# Patient Record
Sex: Female | Born: 1950
Health system: Southern US, Community
[De-identification: ages and names within clinical notes are randomized; demographics above are authoritative.]

## PROBLEM LIST (undated history)

## (undated) DIAGNOSIS — H409 Unspecified glaucoma: Secondary | ICD-10-CM

## (undated) DIAGNOSIS — E039 Hypothyroidism, unspecified: Secondary | ICD-10-CM

## (undated) DIAGNOSIS — E785 Hyperlipidemia, unspecified: Secondary | ICD-10-CM

## (undated) DIAGNOSIS — I10 Essential (primary) hypertension: Secondary | ICD-10-CM

## (undated) DIAGNOSIS — C50919 Malignant neoplasm of unspecified site of unspecified female breast: Secondary | ICD-10-CM

## (undated) DIAGNOSIS — Z923 Personal history of irradiation: Secondary | ICD-10-CM

## (undated) DIAGNOSIS — F419 Anxiety disorder, unspecified: Secondary | ICD-10-CM

## (undated) DIAGNOSIS — E079 Disorder of thyroid, unspecified: Secondary | ICD-10-CM

## (undated) HISTORY — DX: Disorder of thyroid, unspecified: E07.9

## (undated) HISTORY — PX: OTHER SURGICAL HISTORY: SHX169

## (undated) HISTORY — PX: COLONOSCOPY: SHX174

## (undated) HISTORY — PX: REFRACTIVE SURGERY: SHX103

## (undated) HISTORY — PX: CATARACT EXTRACTION: SUR2

## (undated) HISTORY — DX: Anxiety disorder, unspecified: F41.9

## (undated) HISTORY — DX: Hyperlipidemia, unspecified: E78.5

## (undated) HISTORY — DX: Essential (primary) hypertension: I10

## (undated) HISTORY — DX: Unspecified glaucoma: H40.9

---

## 2010-07-17 ENCOUNTER — Emergency Department (HOSPITAL_COMMUNITY): Payer: BC Managed Care – PPO

## 2010-07-17 ENCOUNTER — Emergency Department (HOSPITAL_COMMUNITY)
Admission: EM | Admit: 2010-07-17 | Discharge: 2010-07-18 | Disposition: A | Discharge status: Home / Self Care | Payer: BC Managed Care – PPO | Attending: Emergency Medicine | Admitting: Emergency Medicine

## 2010-07-17 DIAGNOSIS — I10 Essential (primary) hypertension: Secondary | ICD-10-CM | POA: Insufficient documentation

## 2010-07-17 DIAGNOSIS — R059 Cough, unspecified: Secondary | ICD-10-CM | POA: Insufficient documentation

## 2010-07-17 DIAGNOSIS — R0602 Shortness of breath: Secondary | ICD-10-CM | POA: Insufficient documentation

## 2010-07-17 DIAGNOSIS — E039 Hypothyroidism, unspecified: Secondary | ICD-10-CM | POA: Insufficient documentation

## 2010-07-17 DIAGNOSIS — E7800 Pure hypercholesterolemia, unspecified: Secondary | ICD-10-CM | POA: Insufficient documentation

## 2010-07-17 DIAGNOSIS — J189 Pneumonia, unspecified organism: Secondary | ICD-10-CM | POA: Insufficient documentation

## 2010-07-17 DIAGNOSIS — R05 Cough: Secondary | ICD-10-CM | POA: Insufficient documentation

## 2010-07-17 LAB — URINE MICROSCOPIC-ADD ON

## 2010-07-17 LAB — COMPREHENSIVE METABOLIC PANEL
AST: 25 U/L (ref 0–37)
CO2: 21 mEq/L (ref 19–32)
Calcium: 9.3 mg/dL (ref 8.4–10.5)
Creatinine, Ser: 1.5 mg/dL — ABNORMAL HIGH (ref 0.4–1.2)
GFR calc Af Amer: 43 mL/min — ABNORMAL LOW (ref >60)
GFR calc non Af Amer: 35 mL/min — ABNORMAL LOW (ref >60)
Total Protein: 7.1 g/dL (ref 6.0–8.3)

## 2010-07-17 LAB — CBC
HCT: 49.4 % — ABNORMAL HIGH (ref 36.0–46.0)
Hemoglobin: 17.5 g/dL — ABNORMAL HIGH (ref 12.0–15.0)
MCH: 29.5 pg (ref 26.0–34.0)
MCV: 83.2 fL (ref 78.0–100.0)
RBC: 5.94 MIL/uL — ABNORMAL HIGH (ref 3.87–5.11)

## 2010-07-17 LAB — APTT: aPTT: 30 seconds (ref 24–37)

## 2010-07-17 LAB — URINALYSIS, ROUTINE W REFLEX MICROSCOPIC
Bilirubin Urine: NEGATIVE
Leukocytes, UA: NEGATIVE
Nitrite: NEGATIVE
Specific Gravity, Urine: 1.013 (ref 1.005–1.030)
pH: 5 (ref 5.0–8.0)

## 2010-07-17 LAB — POCT CARDIAC MARKERS: Troponin i, poc: 0.06 ng/mL (ref 0.00–0.09)

## 2010-07-17 LAB — PROTIME-INR
INR: 0.91 (ref 0.00–1.49)
Prothrombin Time: 12.5 seconds (ref 11.6–15.2)

## 2010-07-17 LAB — DIFFERENTIAL
Lymphocytes Relative: 6 % — ABNORMAL LOW (ref 12–46)
Lymphs Abs: 1 10*3/uL (ref 0.7–4.0)
Monocytes Absolute: 0.7 10*3/uL (ref 0.1–1.0)
Monocytes Relative: 4 % (ref 3–12)
Neutro Abs: 14.6 10*3/uL — ABNORMAL HIGH (ref 1.7–7.7)
Neutrophils Relative %: 90 % — ABNORMAL HIGH (ref 43–77)

## 2010-07-17 MED ORDER — IOHEXOL 300 MG/ML  SOLN
100.0000 mL | Freq: Once | INTRAMUSCULAR | Status: AC | PRN
  Administered 2010-07-17: 100 mL via INTRAVENOUS

## 2010-07-18 LAB — POCT CARDIAC MARKERS
Myoglobin, poc: 179 ng/mL (ref 12–200)
Troponin i, poc: 0.05 ng/mL (ref 0.00–0.09)

## 2010-10-23 ENCOUNTER — Other Ambulatory Visit (HOSPITAL_COMMUNITY)
Admission: RE | Admit: 2010-10-23 | Discharge: 2010-10-23 | Disposition: A | Discharge status: Home / Self Care | Payer: BC Managed Care – PPO | Source: Ambulatory Visit | Attending: Family Medicine | Admitting: Family Medicine

## 2010-10-23 DIAGNOSIS — Z1159 Encounter for screening for other viral diseases: Secondary | ICD-10-CM | POA: Insufficient documentation

## 2010-10-23 DIAGNOSIS — Z124 Encounter for screening for malignant neoplasm of cervix: Secondary | ICD-10-CM | POA: Insufficient documentation

## 2010-11-13 ENCOUNTER — Other Ambulatory Visit: Payer: Self-pay | Admitting: Family Medicine

## 2010-11-13 DIAGNOSIS — Z1231 Encounter for screening mammogram for malignant neoplasm of breast: Secondary | ICD-10-CM

## 2010-11-27 ENCOUNTER — Ambulatory Visit
Admission: RE | Admit: 2010-11-27 | Discharge: 2010-11-27 | Disposition: A | Discharge status: Home / Self Care | Payer: BC Managed Care – PPO | Source: Ambulatory Visit | Attending: Family Medicine | Admitting: Family Medicine

## 2010-11-27 DIAGNOSIS — Z1231 Encounter for screening mammogram for malignant neoplasm of breast: Secondary | ICD-10-CM

## 2011-12-12 ENCOUNTER — Other Ambulatory Visit: Payer: Self-pay | Admitting: Family Medicine

## 2011-12-12 DIAGNOSIS — Z1231 Encounter for screening mammogram for malignant neoplasm of breast: Secondary | ICD-10-CM

## 2012-01-21 ENCOUNTER — Ambulatory Visit
Admission: RE | Admit: 2012-01-21 | Discharge: 2012-01-21 | Disposition: A | Discharge status: Home / Self Care | Payer: BC Managed Care – PPO | Source: Ambulatory Visit | Attending: Family Medicine | Admitting: Family Medicine

## 2012-01-21 DIAGNOSIS — Z1231 Encounter for screening mammogram for malignant neoplasm of breast: Secondary | ICD-10-CM

## 2012-02-21 ENCOUNTER — Other Ambulatory Visit: Payer: Self-pay | Admitting: Family Medicine

## 2012-02-21 DIAGNOSIS — Z780 Asymptomatic menopausal state: Secondary | ICD-10-CM

## 2012-03-17 ENCOUNTER — Ambulatory Visit
Admission: RE | Admit: 2012-03-17 | Discharge: 2012-03-17 | Disposition: A | Discharge status: Home / Self Care | Payer: BC Managed Care – PPO | Source: Ambulatory Visit | Attending: Family Medicine | Admitting: Family Medicine

## 2012-03-17 DIAGNOSIS — Z780 Asymptomatic menopausal state: Secondary | ICD-10-CM

## 2013-01-19 ENCOUNTER — Other Ambulatory Visit: Payer: Self-pay

## 2013-01-19 DIAGNOSIS — Z1231 Encounter for screening mammogram for malignant neoplasm of breast: Secondary | ICD-10-CM

## 2013-02-23 ENCOUNTER — Ambulatory Visit
Admission: RE | Admit: 2013-02-23 | Discharge: 2013-02-23 | Disposition: A | Discharge status: Home / Self Care | Payer: BC Managed Care – PPO | Source: Ambulatory Visit

## 2013-02-23 DIAGNOSIS — Z1231 Encounter for screening mammogram for malignant neoplasm of breast: Secondary | ICD-10-CM

## 2014-02-04 ENCOUNTER — Other Ambulatory Visit: Payer: Self-pay

## 2014-02-04 DIAGNOSIS — Z1231 Encounter for screening mammogram for malignant neoplasm of breast: Secondary | ICD-10-CM

## 2014-03-15 ENCOUNTER — Ambulatory Visit
Admission: RE | Admit: 2014-03-15 | Discharge: 2014-03-15 | Disposition: A | Discharge status: Home / Self Care | Payer: Medicare PPO | Source: Ambulatory Visit

## 2014-03-15 ENCOUNTER — Encounter (INDEPENDENT_AMBULATORY_CARE_PROVIDER_SITE_OTHER): Payer: Self-pay

## 2014-03-15 DIAGNOSIS — Z1231 Encounter for screening mammogram for malignant neoplasm of breast: Secondary | ICD-10-CM

## 2014-09-27 DIAGNOSIS — Z79899 Other long term (current) drug therapy: Secondary | ICD-10-CM | POA: Diagnosis not present

## 2014-09-27 DIAGNOSIS — H34813 Central retinal vein occlusion, bilateral: Secondary | ICD-10-CM | POA: Diagnosis not present

## 2014-09-27 DIAGNOSIS — H35353 Cystoid macular degeneration, bilateral: Secondary | ICD-10-CM | POA: Diagnosis not present

## 2014-11-15 DIAGNOSIS — H409 Unspecified glaucoma: Secondary | ICD-10-CM | POA: Diagnosis not present

## 2014-11-15 DIAGNOSIS — H35353 Cystoid macular degeneration, bilateral: Secondary | ICD-10-CM | POA: Diagnosis not present

## 2014-11-15 DIAGNOSIS — H34813 Central retinal vein occlusion, bilateral: Secondary | ICD-10-CM | POA: Diagnosis not present

## 2014-11-15 DIAGNOSIS — H269 Unspecified cataract: Secondary | ICD-10-CM | POA: Diagnosis not present

## 2014-12-27 DIAGNOSIS — H348132 Central retinal vein occlusion, bilateral, stable: Secondary | ICD-10-CM | POA: Diagnosis not present

## 2014-12-27 DIAGNOSIS — H35353 Cystoid macular degeneration, bilateral: Secondary | ICD-10-CM | POA: Diagnosis not present

## 2015-01-10 DIAGNOSIS — H2513 Age-related nuclear cataract, bilateral: Secondary | ICD-10-CM | POA: Diagnosis not present

## 2015-01-10 DIAGNOSIS — H348132 Central retinal vein occlusion, bilateral, stable: Secondary | ICD-10-CM | POA: Diagnosis not present

## 2015-01-10 DIAGNOSIS — H578 Other specified disorders of eye and adnexa: Secondary | ICD-10-CM | POA: Diagnosis not present

## 2015-01-10 DIAGNOSIS — H4052X3 Glaucoma secondary to other eye disorders, left eye, severe stage: Secondary | ICD-10-CM | POA: Diagnosis not present

## 2015-01-10 DIAGNOSIS — H401111 Primary open-angle glaucoma, right eye, mild stage: Secondary | ICD-10-CM | POA: Diagnosis not present

## 2015-02-07 DIAGNOSIS — H35353 Cystoid macular degeneration, bilateral: Secondary | ICD-10-CM | POA: Diagnosis not present

## 2015-02-07 DIAGNOSIS — H348132 Central retinal vein occlusion, bilateral, stable: Secondary | ICD-10-CM | POA: Diagnosis not present

## 2015-02-07 DIAGNOSIS — Z79899 Other long term (current) drug therapy: Secondary | ICD-10-CM | POA: Diagnosis not present

## 2015-02-16 DIAGNOSIS — H401111 Primary open-angle glaucoma, right eye, mild stage: Secondary | ICD-10-CM | POA: Diagnosis not present

## 2015-02-16 DIAGNOSIS — H2511 Age-related nuclear cataract, right eye: Secondary | ICD-10-CM | POA: Diagnosis not present

## 2015-03-14 ENCOUNTER — Other Ambulatory Visit: Payer: Self-pay | Admitting: Family Medicine

## 2015-03-14 DIAGNOSIS — M8580 Other specified disorders of bone density and structure, unspecified site: Secondary | ICD-10-CM

## 2015-03-14 DIAGNOSIS — Z1231 Encounter for screening mammogram for malignant neoplasm of breast: Secondary | ICD-10-CM

## 2015-04-11 ENCOUNTER — Other Ambulatory Visit: Payer: Medicare PPO

## 2015-04-11 ENCOUNTER — Ambulatory Visit: Payer: Medicare PPO

## 2015-05-03 ENCOUNTER — Ambulatory Visit
Admission: RE | Admit: 2015-05-03 | Discharge: 2015-05-03 | Disposition: A | Discharge status: Home / Self Care | Payer: Medicare Other | Source: Ambulatory Visit | Attending: Family Medicine | Admitting: Family Medicine

## 2015-05-03 DIAGNOSIS — M8580 Other specified disorders of bone density and structure, unspecified site: Secondary | ICD-10-CM

## 2015-05-03 DIAGNOSIS — Z1231 Encounter for screening mammogram for malignant neoplasm of breast: Secondary | ICD-10-CM

## 2015-05-05 ENCOUNTER — Other Ambulatory Visit: Payer: Self-pay | Admitting: Family Medicine

## 2015-05-05 DIAGNOSIS — R928 Other abnormal and inconclusive findings on diagnostic imaging of breast: Secondary | ICD-10-CM

## 2015-05-12 ENCOUNTER — Ambulatory Visit
Admission: RE | Admit: 2015-05-12 | Discharge: 2015-05-12 | Disposition: A | Discharge status: Home / Self Care | Payer: Medicare Other | Source: Ambulatory Visit | Attending: Family Medicine | Admitting: Family Medicine

## 2015-05-12 ENCOUNTER — Other Ambulatory Visit: Payer: Self-pay | Admitting: Family Medicine

## 2015-05-12 DIAGNOSIS — R928 Other abnormal and inconclusive findings on diagnostic imaging of breast: Secondary | ICD-10-CM

## 2015-05-23 ENCOUNTER — Other Ambulatory Visit: Payer: Self-pay | Admitting: Family Medicine

## 2015-05-23 ENCOUNTER — Ambulatory Visit
Admission: RE | Admit: 2015-05-23 | Discharge: 2015-05-23 | Disposition: A | Discharge status: Home / Self Care | Payer: Medicare Other | Source: Ambulatory Visit | Attending: Family Medicine | Admitting: Family Medicine

## 2015-05-23 ENCOUNTER — Ambulatory Visit
Admission: RE | Admit: 2015-05-23 | Discharge: 2015-05-23 | Disposition: A | Discharge status: Home / Self Care | Payer: No Typology Code available for payment source | Source: Ambulatory Visit | Attending: Family Medicine | Admitting: Family Medicine

## 2015-05-23 DIAGNOSIS — R928 Other abnormal and inconclusive findings on diagnostic imaging of breast: Secondary | ICD-10-CM

## 2015-05-23 DIAGNOSIS — N6310 Unspecified lump in the right breast, unspecified quadrant: Secondary | ICD-10-CM

## 2015-05-23 HISTORY — PX: BREAST BIOPSY: SHX20

## 2015-05-26 ENCOUNTER — Telehealth: Payer: Self-pay | Admitting: *Deleted

## 2015-05-26 DIAGNOSIS — C50511 Malignant neoplasm of lower-outer quadrant of right female breast: Secondary | ICD-10-CM

## 2015-05-26 NOTE — Telephone Encounter (Signed)
Confirmed [facility] for 06/01/15 at 815am .  Instructions and contact information given.

## 2015-06-01 ENCOUNTER — Other Ambulatory Visit: Payer: Self-pay | Admitting: General Surgery

## 2015-06-01 ENCOUNTER — Ambulatory Visit
Admission: RE | Admit: 2015-06-01 | Discharge: 2015-06-01 | Disposition: A | Discharge status: Home / Self Care | Payer: Medicare Other | Source: Ambulatory Visit | Attending: Radiation Oncology | Admitting: Radiation Oncology

## 2015-06-01 ENCOUNTER — Encounter: Payer: Self-pay | Admitting: *Deleted

## 2015-06-01 ENCOUNTER — Other Ambulatory Visit (HOSPITAL_BASED_OUTPATIENT_CLINIC_OR_DEPARTMENT_OTHER): Payer: Medicare Other

## 2015-06-01 ENCOUNTER — Encounter: Payer: Self-pay | Admitting: Physical Therapy

## 2015-06-01 ENCOUNTER — Ambulatory Visit: Payer: Medicare Other | Attending: General Surgery | Admitting: Physical Therapy

## 2015-06-01 ENCOUNTER — Encounter: Payer: Self-pay | Admitting: Radiation Oncology

## 2015-06-01 ENCOUNTER — Encounter: Payer: Self-pay | Admitting: Hematology and Oncology

## 2015-06-01 ENCOUNTER — Ambulatory Visit (HOSPITAL_BASED_OUTPATIENT_CLINIC_OR_DEPARTMENT_OTHER): Payer: Medicare Other | Admitting: Hematology and Oncology

## 2015-06-01 ENCOUNTER — Encounter: Payer: Self-pay | Admitting: Skilled Nursing Facility1

## 2015-06-01 VITALS — BP 168/82 | HR 95 | Temp 97.5°F | Resp 20 | Ht 64.75 in | Wt 193.3 lb

## 2015-06-01 DIAGNOSIS — C50511 Malignant neoplasm of lower-outer quadrant of right female breast: Secondary | ICD-10-CM

## 2015-06-01 DIAGNOSIS — R293 Abnormal posture: Secondary | ICD-10-CM | POA: Insufficient documentation

## 2015-06-01 LAB — CBC WITH DIFFERENTIAL/PLATELET
BASO%: 0.3 % (ref 0.0–2.0)
Basophils Absolute: 0 10*3/uL (ref 0.0–0.1)
EOS%: 2.5 % (ref 0.0–7.0)
Eosinophils Absolute: 0.2 10*3/uL (ref 0.0–0.5)
HEMATOCRIT: 40.7 % (ref 34.8–46.6)
HGB: 14.1 g/dL (ref 11.6–15.9)
LYMPH#: 1.3 10*3/uL (ref 0.9–3.3)
LYMPH%: 17 % (ref 14.0–49.7)
MCH: 30.4 pg (ref 25.1–34.0)
MCHC: 34.6 g/dL (ref 31.5–36.0)
MCV: 87.7 fL (ref 79.5–101.0)
MONO#: 0.6 10*3/uL (ref 0.1–0.9)
MONO%: 7.7 % (ref 0.0–14.0)
NEUT%: 72.5 % (ref 38.4–76.8)
NEUTROS ABS: 5.5 10*3/uL (ref 1.5–6.5)
PLATELETS: 280 10*3/uL (ref 145–400)
RBC: 4.64 10*6/uL (ref 3.70–5.45)
RDW: 13.8 % (ref 11.2–14.5)
WBC: 7.6 10*3/uL (ref 3.9–10.3)

## 2015-06-01 LAB — COMPREHENSIVE METABOLIC PANEL
ALBUMIN: 2.9 g/dL — AB (ref 3.5–5.0)
ALT: 21 U/L (ref 0–55)
ANION GAP: 9 meq/L (ref 3–11)
AST: 15 U/L (ref 5–34)
Alkaline Phosphatase: 141 U/L (ref 40–150)
BILIRUBIN TOTAL: 0.48 mg/dL (ref 0.20–1.20)
BUN: 28.2 mg/dL — ABNORMAL HIGH (ref 7.0–26.0)
CALCIUM: 10.1 mg/dL (ref 8.4–10.4)
CHLORIDE: 99 meq/L (ref 98–109)
CO2: 30 meq/L — AB (ref 22–29)
CREATININE: 1.2 mg/dL — AB (ref 0.6–1.1)
EGFR: 49 mL/min/{1.73_m2} — ABNORMAL LOW (ref >90)
Glucose: 114 mg/dl (ref 70–140)
Potassium: 3.3 mEq/L — ABNORMAL LOW (ref 3.5–5.1)
Sodium: 139 mEq/L (ref 136–145)
TOTAL PROTEIN: 7.4 g/dL (ref 6.4–8.3)

## 2015-06-01 NOTE — Progress Notes (Signed)
Subjective:     Patient ID: Tiffany Thompson, female   DOB: December 27, 1950, 65 y.o.   MRN: [id number]  HPI   Review of Systems     Objective:   Physical Exam For the patient to understand and be given the tools to implement a healthy plant based diet during their cancer diagnosis.     Assessment:     Patient was seen today and found to be in good spirits and accompanied by her daughter. Pts ht 5'4'', 193 pounds, BMI 32.5. Pts labs: potassium 3.3, CO2 30, BUN 28.2, Creatinine 1.2, Albumin 2.9, GFR 49. Pts ht 5'4'', 193 pounds, BMI 32.5. Pts medicaitions: atorvastatin, vitamin D, cinnamon, levothyroxine, omega 3.  Pt seemed interested as long as she can still eat meat.     Plan:     Dietitian educated the patient on implementing a plant based diet by incorporating more plant proteins, fruits, and vegetables. As a part of a healthy routine physical activity was discussed. The importance of legitimate, evidence based information was discussed and examples were given. A folder of evidence based information with a focus on a plant based diet and general nutrition during cancer was given to the patient.  As a part of the continuum of care the cancer dietitian's contact information was given to the patient in the event they would like to have a follow up appointment.

## 2015-06-01 NOTE — Progress Notes (Signed)
[facility] Psychosocial Distress Screening Clinical Social Work  Clinical Social Work met with pt and her daughter to introduce self, explain role of CSW/Support Team and to review distress screening protocol.  The patient scored a 6 on the Psychosocial Distress Thermometer which indicates moderate distress. Pt reports she had issues with anxiety years ago and was prescribed medication to assist. She reports she reached out to her PCP for assistance after diagnosis and feels this is being well addressed. She reports talking with family and friends is helpful as well. Her 6yo grandson is a great distraction currently. Clinical Social Worker further assessed for distress and other psychosocial needs. Pt feels she has good strategies for coping currently. CSW reviewed common emotions/reactions to recent breast cancer diagnosis and coping techniques/resources to assist. Support Program calendar provided today. Pt and daughter encouraged to reach out as needs arise.   ONCBCN DISTRESS SCREENING 06/01/2015  Screening Type Initial Screening  Distress experienced in past week (1-10) 6  Emotional problem type Nervousness/Anxiety;Adjusting to illness;Isolation/feeling alone;Adjusting to appearance changes  Spiritual/Religous concerns type Facing my mortality  Information Concerns Type Lack of info about diagnosis;Lack of info about treatment;Lack of info about complementary therapy choices;Lack of info about maintaining fitness  Physical Problem type Skin dry/itchy  Physician notified of physical symptoms Yes  Referral to clinical social work Yes  Referral to support programs Yes     Clinical Social Worker follow up needed: No.  If yes, follow up plan:

## 2015-06-01 NOTE — Progress Notes (Signed)
Radiation Oncology         [phone] ________________________________  Name: Tiffany Thompson MRN: [id number]  Date: 06/01/2015  DOB: 03-May-1950  TK:[id number] R Rankins, MD  Fanny Skates, MD     REFERRING PHYSICIAN: Fanny Skates, MD   DIAGNOSIS: The encounter diagnosis was Breast cancer of lower-outer quadrant of right female breast [facility]).   HISTORY OF PRESENT ILLNESS: Tiffany Thompson is a 65 y.o. female seen at the in the [facility] Breast clinic after a screening mammogram on 05/03/15 revealed a possible mass of the right breast. A ultrasound on 05/12/15 revealed a 1 x .7 x 1 cm mass along the 8 o'clock position. No adenopathy was noted in the axilla. A biopsy was performed and revealed a ER/PR positive, HER2 negative invasive ductal carcinoma with DCIS. She comes today to meet with Dr. [name] for discussion regarding the role of radiotherapy.     PREVIOUS RADIATION THERAPY: No   PAST MEDICAL HISTORY:  Past Medical History  Diagnosis Date  . Hypertension   . Hyperlipidemia     HYPERCHOLESTEROLEMIA  . Thyroid disease   . Glaucoma     WITH CENTRAL RETINAL VEIN OCCLUSION       PAST SURGICAL HISTORY: Past Surgical History  Procedure Laterality Date  . Tube for glaucoma      TO REDUCE PRESSURE  . Colonoscopy       FAMILY HISTORY:  Family History  Problem Relation Age of Onset  . Diabetes Mellitus I Mother   . Hyperlipidemia Mother   . Heart failure Mother   . Hypertension Father   . Heart failure Father   . Diabetes Mellitus I Father   . Hypertension Sister   . Diabetes Mellitus I Sister   . Hypertension Sister   . Dementia Sister   . Brain cancer Brother      SOCIAL HISTORY:  reports that she has quit smoking. She does not have any smokeless tobacco history on file. She is a retired Pharmacist, hospital. She is widowed and resides in [place].    ALLERGIES: Review of patient's allergies indicates no known allergies.   MEDICATIONS:  Current Outpatient  Prescriptions  Medication Sig Dispense Refill  . atorvastatin (LIPITOR) 20 MG tablet Take 20 mg by mouth daily.    . Calcium Carbonate-Vitamin D (CALCIUM-VITAMIN D) 500-200 MG-UNIT per tablet Take 1 tablet by mouth daily.    . Cinnamon 500 MG capsule Take 100 mg by mouth daily.    . clonazePAM (KLONOPIN) 1 MG tablet Take 1 mg by mouth 2 (two) times daily.    . dorzolamide (TRUSOPT) 2 % ophthalmic solution 1 drop 3 (three) times daily.    . hydrochlorothiazide (HYDRODIURIL) 25 MG tablet Take 25 mg by mouth daily.    Marland Kitchen ibuprofen (ADVIL,MOTRIN) 200 MG tablet Take 200 mg by mouth every 6 (six) hours as needed.    . latanoprost (XALATAN) 0.005 % ophthalmic solution 1 drop at bedtime.    Marland Kitchen levothyroxine (SYNTHROID, LEVOTHROID) 112 MCG tablet Take 112 mcg by mouth daily before breakfast.    . Omega-3 Fatty Acids (FISH OIL) 1000 MG CAPS Take by mouth.    . potassium chloride SA (K-DUR,KLOR-CON) 20 MEQ tablet Take 20 mEq by mouth 2 (two) times daily.    Marland Kitchen telmisartan (MICARDIS) 40 MG tablet Take 40 mg by mouth daily.     No current facility-administered medications for this encounter.     REVIEW OF SYSTEMS: On review of systems, the patient states she is doing  well. She denies any pain since her biopsy. She denies any chest pain, shortness of breath, cough, fevers, chills, night sweats, unintended weight changes. She denies any bowel or bladder disturbances, and denies abdominal pain, nausea or vomiting. She denies any new musculoskeletal or joint aches or pains. A complete review of systems is obtained and is otherwise negative.     PHYSICAL EXAM:   Pain scale 0/10 In general this is a well appearing caucasian female in no acute distress. She is alert and oriented x4 and appropriate throughout the examination. HEENT reveals that the patient is normocephalic, atraumatic. EOMs are intact. PERRLA. Skin is intact without any evidence of gross lesions. Cardiovascular exam reveals a regular rate and  rhythm, no clicks rubs or murmurs are auscultated. Chest is clear to auscultation bilaterally. Lymphatic assessment is performed and does not reveal any adenopathy in the cervical, supraclavicular, axillary, or inguinal chains. Breast exam of the right breast reveals post biopsy change along the lower outer quadrant. She does not have any palpable abnormalities otherwise, the left breast is negative for palpable mass. No nipple discharge is noted or skin changes otherwise bilaterally.  Abdomen has active bowel sounds in all quadrants and is intact. The abdomen is soft, non tender, non distended. Lower extremities are negative for pretibial pitting edema, deep calf tenderness, cyanosis or clubbing.   ECOG = 0  0 - Asymptomatic (Fully active, able to carry on all predisease activities without restriction)  1 - Symptomatic but completely ambulatory (Restricted in physically strenuous activity but ambulatory and able to carry out work of a light or sedentary nature. For example, light housework, office work)  2 - Symptomatic, <50% in bed during the day (Ambulatory and capable of all self care but unable to carry out any work activities. Up and about more than 50% of waking hours)  3 - Symptomatic, >50% in bed, but not bedbound (Capable of only limited self-care, confined to bed or chair 50% or more of waking hours)  4 - Bedbound (Completely disabled. Cannot carry on any self-care. Totally confined to bed or chair)  5 - Death   Eustace Pen MM, Creech RH, Tormey DC, et al. [id number]). "Toxicity and response criteria of the [facility]". [place] Oncol. 5 (6): 649-55    LABORATORY DATA:  Lab Results  Component Value Date   WBC 16.3* 07/17/2010   HGB 17.5* 07/17/2010   HCT 49.4* 07/17/2010   MCV 83.2 07/17/2010   PLT 276 07/17/2010   Lab Results  Component Value Date   NA 137 07/17/2010   K 3.6 07/17/2010   CL 103 07/17/2010   CO2 21 07/17/2010   Lab Results  Component  Value Date   ALT 23 07/17/2010   AST 25 07/17/2010   ALKPHOS 121* 07/17/2010   BILITOT 0.4 07/17/2010      RADIOGRAPHY: Dg Bone Density  05/03/2015  EXAM: DUAL X-RAY ABSORPTIOMETRY (DXA) FOR BONE MINERAL DENSITY IMPRESSION: Referring Physician:  Milagros Evener PATIENT: Name: CHRISTYANNA, MCKEON Patient ID: [id number] Birth Date: 06-24-1950 Height: 65.5 in. Sex: Female Measured: 05/03/2015 Weight: 191.0 lbs. Indications: Caucasian, Estrogen Deficient, Hypothyroid, Postmenopausal, Synthroid Fractures: Treatments: Calcium (E943.0), Vitamin D (E933.5) ASSESSMENT: The BMD measured at Femur Neck Right is 0.883 g/cm2 with a T-score of -1.1. This patient is considered to be osteopenic according to [facility]) criteria. L-4 was excluded due to degenerative changes. There has been no statistically significant change in BMD of Left hip since prior exam dated 03/17/2012.  Site Region Measured Date Measured Age YA BMD Significant CHANGE T-score DualFemur Neck Right 05/03/2015    64.2         -1.1    0.883 g/cm2 AP Spine  L1-L3      05/03/2015    [age]         1.1     1.317 g/cm2 World Health Organization [facility]) criteria for post-menopausal, Caucasian Women: Normal       T-score at or above -1 SD Osteopenia   T-score between -1 and -2.5 SD Osteoporosis T-score at or below -2.5 SD RECOMMENDATION: [facility] recommends that FDA-approved medical therapies be considered in postmenopausal women and men age 70 or older with a: 1. Hip or vertebral (clinical or morphometric) fracture. 2. T-score of <-2.5 at the spine or hip. 3. Ten-year fracture probability by FRAX of 3% or greater for hip fracture or 20% or greater for major osteoporotic fracture. All treatment decisions require clinical judgment and consideration of individual patient factors, including patient preferences, co-morbidities, previous drug use, risk factors not captured in the FRAX model (e.g. falls, vitamin D deficiency,  increased bone turnover, interval significant decline in bone density) and possible under - or over-estimation of fracture risk by FRAX. All patients should ensure an adequate intake of dietary calcium (1200 mg/d) and vitamin D (800 IU daily) unless contraindicated. FOLLOW-UP: People with diagnosed cases of osteoporosis or at high risk for fracture should have regular bone mineral density tests. For patients eligible for Medicare, routine testing is allowed once every 2 years. The testing frequency can be increased to one year for patients who have rapidly progressing disease, those who are receiving or discontinuing medical therapy to restore bone mass, or have additional risk factors. I have reviewed this report, and agree with the above findings. [facility] FRAX* 10-year Probability of Fracture Based on femoral neck BMD: DualFemur (Right) Major Osteoporotic Fracture: 8.1% Hip Fracture:                0.7% Population:                  [place] (Caucasian) Risk Factors:                None *FRAX is a Materials engineer of the [employer] for Metabolic Bone Disease, a World Pharmacologist (WHO) [facility]. ASSESSMENT: The probability of a major osteoporotic fracture is 8.1 % within the next ten years. The probability of a hip fracture is 0.7 % within the next ten years. Electronically Signed   By: David  Martinique M.D.   On: 05/03/2015 11:34   Mm Digital Diagnostic Unilat R  05/23/2015  CLINICAL DATA:  Ultrasound-guided biopsy was recommended and performed. A 10 mm suspicious mass in the 8 o'clock position of the right breast was performed. EXAM: DIAGNOSTIC RIGHT MAMMOGRAM POST ULTRASOUND BIOPSY COMPARISON:  Previous exam(s). FINDINGS: Mammographic images were obtained following ultrasound guided biopsy of a 10 mm mass 8 o'clock position right breast 8 cm from the nipple. A ribbon shaped biopsy clip is satisfactorily positioned along the posteromedial margin of the  biopsied mass. IMPRESSION: Satisfactory position of ribbon shaped biopsy clip, along the posteromedial margin of the mass. Final Assessment: Post Procedure Mammograms for Marker Placement Electronically Signed   By: Curlene Dolphin M.D.   On: 05/23/2015 14:37   Mm Digital Screening Bilateral  05/04/2015  CLINICAL DATA:  Screening. EXAM: DIGITAL SCREENING BILATERAL MAMMOGRAM WITH CAD COMPARISON:  Previous exam(s). ACR  Breast Density Category b: There are scattered areas of fibroglandular density. FINDINGS: In the right breast, a possible mass warrants further evaluation. In the left breast, no findings suspicious for malignancy. Images were processed with CAD. IMPRESSION: Further evaluation is suggested for possible mass in the right breast. RECOMMENDATION: Diagnostic mammogram and possibly ultrasound of the right breast. (Code:[id number]-72M) The patient will be contacted regarding the findings, and additional imaging will be scheduled. BI-RADS CATEGORY  0: Incomplete. Need additional imaging evaluation and/or prior mammograms for comparison. Electronically Signed   By: Margarette [place] M.D.   On: 05/04/2015 14:24   US Breast Ltd Uni Right Inc Axilla  05/12/2015  CLINICAL DATA:  65 year old female, callback from screening mammogram for possible right breast mass EXAM: DIGITAL DIAGNOSTIC RIGHT MAMMOGRAM WITH 3D TOMOSYNTHESIS WITH CAD ULTRASOUND RIGHT BREAST COMPARISON:  Previous exam(s). ACR Breast Density Category c: The breast tissue is heterogeneously dense, which may obscure small masses. FINDINGS: CC and MLO views of the right breast with tomosynthesis were performed. On the additional views, there is a 9 mm spiculated mass within the lower, outer right breast. No additional suspicious abnormality is identified. Mammographic images were processed with CAD. On physical exam, there is an approximately 1 cm palpable mass within the right breast at 8 o'clock, 8 cm from the nipple. Targeted ultrasound is performed, showing  an irregular, hypoechoic mass 8 o'clock, 8 cm from the nipple measuring 10 x 7 x 10 mm, corresponding to the abnormality seen on mammogram. Ultrasound of the right axilla demonstrates no suspicious appearing right axillary lymph nodes. IMPRESSION: Suspicious right breast mass. RECOMMENDATION: Ultrasound-guided right breast biopsy. I have discussed the findings and recommendations with the patient. Results were also provided in writing at the conclusion of the visit. If applicable, a reminder letter will be sent to the patient regarding the next appointment. BI-RADS CATEGORY  5: Highly suggestive of malignancy. Electronically Signed   By: Pamelia Hoit M.D.   On: 05/12/2015 11:48   Mm Diag Breast Tomo Uni Right  05/12/2015  CLINICAL DATA:  65 year old female, callback from screening mammogram for possible right breast mass EXAM: DIGITAL DIAGNOSTIC RIGHT MAMMOGRAM WITH 3D TOMOSYNTHESIS WITH CAD ULTRASOUND RIGHT BREAST COMPARISON:  Previous exam(s). ACR Breast Density Category c: The breast tissue is heterogeneously dense, which may obscure small masses. FINDINGS: CC and MLO views of the right breast with tomosynthesis were performed. On the additional views, there is a 9 mm spiculated mass within the lower, outer right breast. No additional suspicious abnormality is identified. Mammographic images were processed with CAD. On physical exam, there is an approximately 1 cm palpable mass within the right breast at 8 o'clock, 8 cm from the nipple. Targeted ultrasound is performed, showing an irregular, hypoechoic mass 8 o'clock, 8 cm from the nipple measuring 10 x 7 x 10 mm, corresponding to the abnormality seen on mammogram. Ultrasound of the right axilla demonstrates no suspicious appearing right axillary lymph nodes. IMPRESSION: Suspicious right breast mass. RECOMMENDATION: Ultrasound-guided right breast biopsy. I have discussed the findings and recommendations with the patient. Results were also provided in writing at  the conclusion of the visit. If applicable, a reminder letter will be sent to the patient regarding the next appointment. BI-RADS CATEGORY  5: Highly suggestive of malignancy. Electronically Signed   By: Pamelia Hoit M.D.   On: 05/12/2015 11:48   [place] Rt Breast Bx W Loc Dev 1st Lesion Img Bx Spec US Guide  05/27/2015  ADDENDUM REPORT: 05/24/2015 13:38 ADDENDUM: Pathology revealed  GRADE I INVASIVE DUCTAL CARCINOMA, DUCTAL CARCINOMA IN SITU of the Right breast at the 8:00 o'clock location. This was found to be concordant by Dr. Curlene Dolphin. Pathology results were discussed with the patient by telephone. The patient reported doing well after the biopsy. Post biopsy instructions and care were reviewed and questions were answered. The patient was encouraged to call [facility] for any additional concerns. The patient was referred to [facility] on June 01, 2015. Pathology results reported by Terie Purser, RN on 05/24/2015. Electronically Signed   By: Curlene Dolphin M.D.   On: 05/24/2015 13:38  05/27/2015  CLINICAL DATA:  Suspicious mass 8 o'clock position right breast identified on recent screening mammogram. Ultrasound-guided biopsy was recommended. EXAM: ULTRASOUND GUIDED RIGHT BREAST CORE NEEDLE BIOPSY COMPARISON:  Previous exam(s). FINDINGS: I met with the patient and we discussed the procedure of ultrasound-guided biopsy, including benefits and alternatives. We discussed the high likelihood of a successful procedure. We discussed the risks of the procedure, including infection, bleeding, tissue injury, clip migration, and inadequate sampling. Informed written consent was given. The usual time-out protocol was performed immediately prior to the procedure. Using sterile technique and 1% Lidocaine as local anesthetic, under direct ultrasound visualization, a 14 gauge spring-loaded device was used to perform biopsy of  a 1.0 cm mass using a lateral to medial approach. At the conclusion of the procedure a ribbon shaped tissue marker clip was deployed into the biopsy cavity. Follow up 2 view mammogram was performed and dictated separately. IMPRESSION: Ultrasound guided biopsy of the right breast. No apparent complications. Electronically Signed: By: Curlene Dolphin M.D. On: 05/23/2015 14:29       IMPRESSION:  Stage I ER/PR positive, HER2 negative invasive ductal carcinoma of the right breast.   PLAN: Dr. [name] discusses the pathology findings and reviews the nature of the patient's breast disease. The consensus from the breast conference include breast conservation with lumpectomy with sentinel node evaluation, oncotype testing, followed by external radiotherapy to the breast followed by antiestrogen therapy. She will meet with medical oncology and surgery today. We will move forward about 2 weeks after surgery to see her and plan simulation. We anticipate beginning treatment about 4 weeks after surgery and Dr. [name] recommends hypofractionated course of 20 fractions to the right breast. The risks, benefits, short, and long term side effects of treatment was reviewed and the patient would like to move forward provided that she is deemed a lumpectomy candidate by Dr. [name].    The above documentation reflects my direct findings during this shared patient visit. Please see the separate note by Dr. [name] on this date for the remainder of the patient's plan of care.    Carola Rhine, PAC

## 2015-06-01 NOTE — Patient Instructions (Signed)

## 2015-06-01 NOTE — Therapy (Signed)
[place], [place], [address] Phone: [phone]   Fax:  [phone]  Physical Therapy Evaluation  Patient Details  Name: Tiffany Thompson MRN: [id number] Date of Birth: 17-Nov-1950 Referring Provider: Dr. Fanny Skates  Encounter Date: 06/01/2015      PT End of Session - 06/01/15 1451    Visit Number 1   Number of Visits 1   PT Start Time 1015   PT Stop Time 1042   PT Time Calculation (min) 27 min   Activity Tolerance Patient tolerated treatment well   Behavior During Therapy [facility] for tasks assessed/performed      Past Medical History  Diagnosis Date  . Hypertension   . Hyperlipidemia     HYPERCHOLESTEROLEMIA  . Thyroid disease   . Glaucoma     WITH CENTRAL RETINAL VEIN OCCLUSION  . Anxiety     Past Surgical History  Procedure Laterality Date  . Tube for glaucoma      TO REDUCE PRESSURE  . Colonoscopy    . Cataract extraction    . Refractive surgery      There were no vitals filed for this visit.  Visit Diagnosis:  Carcinoma of lower outer quadrant of right breast ([place]) - Plan: PT plan of care cert/re-cert  Abnormal posture - Plan: PT plan of care cert/re-cert      Subjective Assessment - 06/01/15 1451    Subjective Patient reports she is here today for an assessment of her newly diagnosed right breast cancer.   Patient is accompained by: Family member   Pertinent History Patient was diagnosed on 05/24/15 with right invasive ductal carcinoma breast cancer in the lower outer quadrant in the 8:00 position.  It is ER/PR positive and HER2 negative.   Patient Stated Goals Reduce lymphedema risk and learn post op shoulder ROM HEP            [facility] PT Assessment - 06/01/15 0001    Assessment   Medical Diagnosis Right breast cancer   Referring Provider Dr. Fanny Skates   Onset Date/Surgical Date 05/24/15   Hand Dominance Right   Prior Therapy none   Precautions   Precautions  Other (comment)   Precaution Comments Active breast cancer; blind in left eye   Restrictions   Weight Bearing Restrictions No   Balance Screen   Has the patient fallen in the past 6 months No   Has the patient had a decrease in activity level because of a fear of falling?  No   Is the patient reluctant to leave their home because of a fear of falling?  No   Home Ecologist residence   Living Arrangements Alone   Available Help at Discharge Family   Prior Function   Level of [place] Retired  Retired from [employer]   Leisure She does not exercise   Cognition   Overall Cognitive Status Within Functional Limits for tasks assessed   Posture/Postural Control   Posture/Postural Control Postural limitations   Postural Limitations Forward head;Rounded Shoulders   ROM / Strength   AROM / PROM / Strength AROM;Strength   AROM   AROM Assessment Site Shoulder;Cervical   Right/Left Shoulder Right;Left   Right Shoulder Extension 46 Degrees   Right Shoulder Flexion 147 Degrees   Right Shoulder ABduction 150 Degrees   Right Shoulder Internal Rotation 72 Degrees   Right Shoulder External Rotation 68 Degrees   Left Shoulder Extension  45 Degrees   Left Shoulder Flexion 145 Degrees   Left Shoulder ABduction 156 Degrees   Left Shoulder Internal Rotation 79 Degrees   Left Shoulder External Rotation 69 Degrees   Cervical Flexion WNL   Cervical Extension WNL   Cervical - Right Side Bend 25% limited   Cervical - Left Side Bend WNL   Cervical - Right Rotation 25% limited   Cervical - Left Rotation 25% limited   Strength   Overall Strength Within functional limits for tasks performed           LYMPHEDEMA/ONCOLOGY QUESTIONNAIRE - 06/01/15 1442    Type   Cancer Type Right breast cancer   Lymphedema Assessments   Lymphedema Assessments Upper extremities   Right Upper Extremity Lymphedema   10 cm Proximal to Olecranon Process 31.8  cm   Olecranon Process 26.8 cm   10 cm Proximal to Ulnar Styloid Process 24.4 cm   Just Proximal to Ulnar Styloid Process 16.8 cm   Across Hand at [employer] 18.1 cm   At [place] of 2nd Digit 6.5 cm   Left Upper Extremity Lymphedema   10 cm Proximal to Olecranon Process 31.2 cm   Olecranon Process 25.3 cm   10 cm Proximal to Ulnar Styloid Process 23.1 cm   Just Proximal to Ulnar Styloid Process 16.8 cm   Across Hand at [employer] 18 cm   At [place] of 2nd Digit 6.5 cm      Patient was instructed today in a home exercise program today for post op shoulder range of motion. These included active assist shoulder flexion in sitting, scapular retraction, wall walking with shoulder abduction, and hands behind head external rotation.  She was encouraged to do these twice a day, holding 3 seconds and repeating 5 times when permitted by her physician.         PT Education - 06/01/15 1451    Education provided Yes   Education Details Lymphedema risk reduction and post op shoulder ROM HEP   Person(s) Educated Patient;Child(ren)   Methods Explanation;Demonstration;Handout   Comprehension Returned demonstration;Verbalized understanding              Breast Clinic Goals - 06/01/15 1454    Patient will be able to verbalize understanding of pertinent lymphedema risk reduction practices relevant to her diagnosis specifically related to skin care.   Time 1   Period Days   Status Achieved   Patient will be able to return demonstrate and/or verbalize understanding of the post-op home exercise program related to regaining shoulder range of motion.   Time 1   Period Days   Status Achieved   Patient will be able to verbalize understanding of the importance of attending the postoperative After Breast Cancer Class for further lymphedema risk reduction education and therapeutic exercise.   Time 1   Period Days   Status Achieved              Plan - 06/01/15 1452    Clinical  Impression Statement Patient was diagnosed on 05/24/15 with right invasive ductal carcinoma breast cancer in the lower outer quadrant in the 8:00 position.  It is ER/PR positive and HER2 negative.  she is planning to have a right lumpectomy and sentinel node biopsy, Oncotype testing, radiation and anti-estrogen therapy.  She may benefit from post op PT to regain shoulder ROM and reduce lympehdema risk.   Pt will benefit from skilled therapeutic intervention in order to improve on the following deficits  Decreased strength;Pain;Decreased knowledge of precautions;Impaired UE functional use;Decreased range of motion   Rehab Potential Excellent   Clinical Impairments Affecting Rehab Potential none   PT Frequency One time visit   PT Treatment/Interventions Therapeutic exercise;Patient/family education   PT Next Visit Plan Will f/u after surgery   PT Home Exercise Plan Issued post op HEP for shoulder ROM   Consulted and Agree with Plan of Care Patient;Family member/caregiver   Family Member Consulted Adult daughter     Patient will follow up at outpatient cancer rehab if needed following surgery.  If the patient requires physical therapy at that time, a specific plan will be dictated and sent to the referring physician for approval. The patient was educated today on appropriate basic range of motion exercises to begin post operatively and the importance of attending the After Breast Cancer class following surgery.  Patient was educated today on lymphedema risk reduction practices as it pertains to recommendations that will benefit the patient immediately following surgery.  She verbalized good understanding.  No additional physical therapy is indicated at this time.         G-Codes - Jun 09, 2015 1455    Functional Assessment Tool Used Clinical Judgement   Functional Limitation Other PT primary   Other PT Primary Current Status (B7048) At least 1 percent but less than 20 percent impaired, limited or  restricted   Other PT Primary Goal Status (G8916) At least 1 percent but less than 20 percent impaired, limited or restricted   Other PT Primary Discharge Status (X4503) At least 1 percent but less than 20 percent impaired, limited or restricted       Problem List Patient Active Problem List   Diagnosis Date Noted  . Breast cancer of lower-outer quadrant of right female breast [facility]) 05/26/2015    Annia Friendly, PT June 09, 2015 2:58 PM  [facility] [address] [place], [place], [address] Phone: [phone]   Fax:  [phone]  Name: Tiffany Thompson MRN: [id number] Date of Birth: 08/18/1950

## 2015-06-01 NOTE — Assessment & Plan Note (Signed)
Right breast biopsy 8:00 position 05/23/2015: Invasive ductal carcinoma with DCIS, grade 1, ER 100%, PR 100%, Ki-67 10%, HER-2 negative ratio 1.36, mammogram revealed a 10 mm mass 8 cm from the nipple, T1b N0 stage IA clinical stage  Pathology and radiology counseling:Discussed with the patient, the details of pathology including the type of breast cancer,the clinical staging, the significance of ER, PR and HER-2/neu receptors and the implications for treatment. After reviewing the pathology in detail, we proceeded to discuss the different treatment options between surgery, radiation, chemotherapy, antiestrogen therapies.  Recommendations: 1. Breast conserving surgery followed by 2. Oncotype DX testing to determine if chemotherapy would be of any benefit followed by 3. Adjuvant radiation therapy followed by 4. Adjuvant antiestrogen therapy  Oncotype counseling: I discussed Oncotype DX test. I explained to the patient that this is a 21 gene panel to evaluate patient tumors DNA to calculate recurrence score. This would help determine whether patient has high risk or intermediate risk or low risk breast cancer. She understands that if her tumor was found to be high risk, she would benefit from systemic chemotherapy. If low risk, no need of chemotherapy. If she was found to be intermediate risk, we would need to evaluate the score as well as other risk factors and determine if an abbreviated chemotherapy may be of benefit.  Return to clinic after surgery to discuss final pathology report and then determine if Oncotype DX testing will need to be sent.

## 2015-06-01 NOTE — Progress Notes (Signed)
[facility] NOTE  Patient Care Team: Aretta Nip, MD as PCP - General (Family Medicine)  CHIEF COMPLAINTS/PURPOSE OF CONSULTATION:  Newly diagnosed breast cancer  HISTORY OF PRESENTING ILLNESS:  Tiffany Thompson 65 y.o. female is here because of recent diagnosis of right breast cancer. Patient had a routine screening mammogram that revealed an abnormality in the right breast in the lower outer quadrant measuring 1 and a meter at 8:00 position. There were no enlarged lymph nodes by ultrasound. Ultrasound-guided biopsy was performed which revealed a grade 1 invasive ductal carcinoma with DCIS, ER/PR positive HER-2 negative with a Ki-67 of 10%. She was present for the multidisciplinary tumor board and she is here today accompanied by her daughter to discuss a treatment plan. She tells me that her husband had passed away from stroke but he had a history of testicular cancer and had apparently been cured from it.  I reviewed her records extensively and collaborated the history with the patient.  SUMMARY OF ONCOLOGIC HISTORY:   Breast cancer of lower-outer quadrant of right female breast ([place])   05/23/2015 Initial Diagnosis Right breast biopsy 8:00 position: Invasive ductal carcinoma with DCIS, grade 1, ER 100%, PR 100%, Ki-67 10%, HER-2 negative ratio 1.36, mammogram revealed a 10 mm mass 8 cm from the nipple, T1b N0 stage IA clinical stage    In terms of breast cancer risk profile:  She menarched at early age of 18 and went to menopause at age 27  She had one pregnancy, her first child was born at age 24  She has received birth control pills for approximately 4 years.  She was never exposed to fertility medications or hormone replacement therapy.  She has no family history of Breast/GYN/GI cancer Brain tumors run in the family with the brother and niece diagnosed with it  MEDICAL HISTORY:  Past Medical History  Diagnosis Date  . Hypertension   . Hyperlipidemia      HYPERCHOLESTEROLEMIA  . Thyroid disease   . Glaucoma     WITH CENTRAL RETINAL VEIN OCCLUSION  . Anxiety     SURGICAL HISTORY: Past Surgical History  Procedure Laterality Date  . Tube for glaucoma      TO REDUCE PRESSURE  . Colonoscopy    . Cataract extraction    . Refractive surgery      SOCIAL HISTORY: Social History   Social History  . Marital Status: Unknown    Spouse Name: N/A  . Number of Children: N/A  . Years of Education: N/A   Occupational History  . Not on file.   Social History Main Topics  . Smoking status: Former Smoker -- 1.50 packs/day    Types: Cigarettes    Quit date: 05/31/2004  . Smokeless tobacco: Not on file  . Alcohol Use: No  . Drug Use: No  . Sexual Activity: Not on file   Other Topics Concern  . Not on file   Social History Narrative    FAMILY HISTORY: Family History  Problem Relation Age of Onset  . Diabetes Mellitus I Mother   . Hyperlipidemia Mother   . Heart failure Mother   . Hypertension Father   . Heart failure Father   . Diabetes Mellitus I Father   . Hypertension Sister   . Diabetes Mellitus I Sister   . Hypertension Sister   . Dementia Sister   . Brain cancer Brother     ALLERGIES:  is allergic to brimonidine; dorzolamide; and dorzolamide hcl-timolol  mal pf.  MEDICATIONS:  Current Outpatient Prescriptions  Medication Sig Dispense Refill  . atorvastatin (LIPITOR) 20 MG tablet Take 20 mg by mouth daily.    . Calcium Carbonate-Vitamin D (CALCIUM-VITAMIN D) 500-200 MG-UNIT per tablet Take 1 tablet by mouth daily.    . Cinnamon 500 MG capsule Take 100 mg by mouth daily.    . clonazePAM (KLONOPIN) 1 MG tablet Take 1 mg by mouth 2 (two) times daily. Reported on 06/01/2015    . hydrochlorothiazide (HYDRODIURIL) 25 MG tablet Take 25 mg by mouth daily.    Marland Kitchen ibuprofen (ADVIL,MOTRIN) 200 MG tablet Take 200 mg by mouth every 6 (six) hours as needed.    . latanoprost (XALATAN) 0.005 % ophthalmic solution 1 drop at bedtime.     Marland Kitchen levothyroxine (SYNTHROID, LEVOTHROID) 112 MCG tablet Take 112 mcg by mouth daily before breakfast.    . Omega-3 Fatty Acids (FISH OIL) 1000 MG CAPS Take by mouth.    . potassium chloride SA (K-DUR,KLOR-CON) 20 MEQ tablet Take 20 mEq by mouth 2 (two) times daily.    . prednisoLONE acetate (PRED FORTE) 1 % ophthalmic suspension Apply to eye.    . telmisartan (MICARDIS) 40 MG tablet Take 40 mg by mouth daily.    . timolol (TIMOPTIC) 0.5 % ophthalmic solution Apply to eye.     No current facility-administered medications for this visit.    REVIEW OF SYSTEMS:   Constitutional: Denies fevers, chills or abnormal night sweats Eyes: Denies blurriness of vision, double vision or watery eyes Ears, nose, mouth, throat, and face: Denies mucositis or sore throat Respiratory: Denies cough, dyspnea or wheezes Cardiovascular: Denies palpitation, chest discomfort or lower extremity swelling Gastrointestinal:  Denies nausea, heartburn or change in bowel habits Skin: Denies abnormal skin rashes Lymphatics: Denies new lymphadenopathy or easy bruising Neurological:Denies numbness, tingling or new weaknesses Behavioral/Psych: Mood is stable, no new changes  Breast:  Denies any palpable lumps or discharge All other systems were reviewed with the patient and are negative.  PHYSICAL EXAMINATION: ECOG PERFORMANCE STATUS: 0 - Asymptomatic  Filed Vitals:   06/01/15 0908  BP: 168/82  Pulse: 95  Temp: 97.5 F (36.4 C)  Resp: 20   Filed Weights   06/01/15 0908  Weight: 193 lb 4.8 oz (87.68 kg)    GENERAL:alert, no distress and comfortable SKIN: skin color, texture, turgor are normal, no rashes or significant lesions EYES: normal, conjunctiva are pink and non-injected, sclera clear OROPHARYNX:no exudate, no erythema and lips, buccal mucosa, and tongue normal  NECK: supple, thyroid normal size, non-tender, without nodularity LYMPH:  no palpable lymphadenopathy in the cervical, axillary or  inguinal LUNGS: clear to auscultation and percussion with normal breathing effort HEART: regular rate & rhythm and no murmurs and no lower extremity edema ABDOMEN:abdomen soft, non-tender and normal bowel sounds Musculoskeletal:no cyanosis of digits and no clubbing  PSYCH: alert & oriented x 3 with fluent speech NEURO: no focal motor/sensory deficits BREAST: No palpable nodules in breast. No palpable axillary or supraclavicular lymphadenopathy (exam performed in the presence of a chaperone)   LABORATORY DATA:  I have reviewed the data as listed Lab Results  Component Value Date   WBC 7.6 06/01/2015   HGB 14.1 06/01/2015   HCT 40.7 06/01/2015   MCV 87.7 06/01/2015   PLT 280 06/01/2015   Lab Results  Component Value Date   NA 139 06/01/2015   K 3.3* 06/01/2015   CL 103 07/17/2010   CO2 30* 06/01/2015    RADIOGRAPHIC STUDIES:  I have personally reviewed the radiological reports and agreed with the findings in the report.  ASSESSMENT AND PLAN:  Breast cancer of lower-outer quadrant of right female breast ([place]) Right breast biopsy 8:00 position 05/23/2015: Invasive ductal carcinoma with DCIS, grade 1, ER 100%, PR 100%, Ki-67 10%, HER-2 negative ratio 1.36, mammogram revealed a 10 mm mass 8 cm from the nipple, T1b N0 stage IA clinical stage  Pathology and radiology counseling:Discussed with the patient, the details of pathology including the type of breast cancer,the clinical staging, the significance of ER, PR and HER-2/neu receptors and the implications for treatment. After reviewing the pathology in detail, we proceeded to discuss the different treatment options between surgery, radiation, chemotherapy, antiestrogen therapies.  Recommendations: 1. Breast conserving surgery followed by 2. Oncotype DX testing to determine if chemotherapy would be of any benefit followed by 3. Adjuvant radiation therapy followed by 4. Adjuvant antiestrogen therapy  Oncotype counseling: I discussed  Oncotype DX test. I explained to the patient that this is a 21 gene panel to evaluate patient tumors DNA to calculate recurrence score. This would help determine whether patient has high risk or intermediate risk or low risk breast cancer. She understands that if her tumor was found to be high risk, she would benefit from systemic chemotherapy. If low risk, no need of chemotherapy. If she was found to be intermediate risk, we would need to evaluate the score as well as other risk factors and determine if an abbreviated chemotherapy may be of benefit.  Return to clinic after surgery to discuss final pathology report and then determine if Oncotype DX testing will need to be sent.   All questions were answered. The patient knows to call the clinic with any problems, questions or concerns.    Rulon Eisenmenger, MD 06/01/2015

## 2015-06-02 ENCOUNTER — Encounter: Payer: Self-pay | Admitting: *Deleted

## 2015-06-03 ENCOUNTER — Other Ambulatory Visit: Payer: Self-pay | Admitting: General Surgery

## 2015-06-03 DIAGNOSIS — C50511 Malignant neoplasm of lower-outer quadrant of right female breast: Secondary | ICD-10-CM

## 2015-06-06 ENCOUNTER — Telehealth: Payer: Self-pay | Admitting: Hematology and Oncology

## 2015-06-06 ENCOUNTER — Other Ambulatory Visit: Payer: Self-pay | Admitting: *Deleted

## 2015-06-06 ENCOUNTER — Telehealth: Payer: Self-pay | Admitting: *Deleted

## 2015-06-06 NOTE — Telephone Encounter (Signed)
Spoke with patient to follow up from [facility] 06/01/15.  She states she is doing well and denies any questions or concerns at this time.  Encouraged her to call with any needs or concerns.

## 2015-06-06 NOTE — Telephone Encounter (Signed)
spoke w/ pt confirmed may appt

## 2015-06-09 ENCOUNTER — Encounter (HOSPITAL_BASED_OUTPATIENT_CLINIC_OR_DEPARTMENT_OTHER): Payer: Self-pay | Admitting: *Deleted

## 2015-06-13 ENCOUNTER — Encounter (HOSPITAL_BASED_OUTPATIENT_CLINIC_OR_DEPARTMENT_OTHER)
Admission: RE | Admit: 2015-06-13 | Discharge: 2015-06-13 | Disposition: A | Discharge status: Home / Self Care | Payer: Medicare Other | Source: Ambulatory Visit | Attending: General Surgery | Admitting: General Surgery

## 2015-06-13 DIAGNOSIS — I10 Essential (primary) hypertension: Secondary | ICD-10-CM | POA: Insufficient documentation

## 2015-06-13 DIAGNOSIS — C50511 Malignant neoplasm of lower-outer quadrant of right female breast: Secondary | ICD-10-CM | POA: Diagnosis not present

## 2015-06-13 DIAGNOSIS — Z01810 Encounter for preprocedural cardiovascular examination: Secondary | ICD-10-CM | POA: Diagnosis not present

## 2015-06-13 DIAGNOSIS — Z01812 Encounter for preprocedural laboratory examination: Secondary | ICD-10-CM | POA: Insufficient documentation

## 2015-06-13 LAB — BASIC METABOLIC PANEL
Anion gap: 12 (ref 5–15)
BUN: 17 mg/dL (ref 6–20)
CO2: 28 mmol/L (ref 22–32)
Calcium: 10 mg/dL (ref 8.9–10.3)
Chloride: 96 mmol/L — ABNORMAL LOW (ref 101–111)
Creatinine, Ser: 1 mg/dL (ref 0.44–1.00)
GFR calc Af Amer: >60 mL/min (ref >60)
GFR, EST NON AFRICAN AMERICAN: 57 mL/min — AB (ref >60)
GLUCOSE: 108 mg/dL — AB (ref 65–99)
POTASSIUM: 3.8 mmol/L (ref 3.5–5.1)
Sodium: 136 mmol/L (ref 135–145)

## 2015-06-16 ENCOUNTER — Ambulatory Visit
Admission: RE | Admit: 2015-06-16 | Discharge: 2015-06-16 | Disposition: A | Discharge status: Home / Self Care | Payer: Medicare Other | Source: Ambulatory Visit | Attending: General Surgery | Admitting: General Surgery

## 2015-06-16 DIAGNOSIS — C50511 Malignant neoplasm of lower-outer quadrant of right female breast: Secondary | ICD-10-CM

## 2015-06-19 NOTE — H&P (Signed)
Tiffany Thompson  Location: [facility] Patient #: [id number] DOB: April 13, 1950 Undefined / Language: [name] / Race: White Female       History of Present Illness      This is a 65 year old Caucasian female, referred by Dr. Curlene Dolphin at the breast center of [facility] for evaluation and management of a 1 cm invasive ductal carcinoma of the right breast, lower outer quadrant. Dr. Harlene Ramus is her PCP. She was evaluated in the [facility]  by Dr. Lindi Adie, Dr. [name], and me.  She has never had a breast problem in the past. Recent screening screening mammogram and ultrasound show a 10 mm spiculated mass in the lower outer quadrant of the right breast at the 8:00 position. Image guided biopsy shows invasive ductal carcinoma and some DCIS. Hormone receptor strongly positive, Ki-67 10%. HER-2 negative.  Family history is negative for breast, ovarian, or pancreatic cancer. Mother died of diabetes and congestive heart failure. Father died of coronary artery disease  She is a widow. Her one daughter is here with her today. She lives alone and is retired. She is a former smoker.  We discussed her case in breast conference. I have coordinated her care plan with Dr. [name] and Dr. Lindi Adie. She is strongly motivated for lumpectomy and breast conservation, and I think she is a good candidate for that. We talked for a long time about lumpectomy, sentinel node biopsy, mastectomy, with or without reconstruction and she is very comfortable with all this. The next step in her care is to proceed with surgery. I discussed the indications details techniques and numerous risk of this surgery with her She is aware the risk of bleeding, infection, reoperation for positive margins or positive nodes, cosmetic deformity, nerve damage with chronic pain or numbness, arm swelling, arm numbness.She will be scheduled for right partial mastectomy with radioactive seed localization, right axillary  sentinel lymph node biopsy.She understands all of these issues. All of her questions are answered. She agrees with this plan.   Other Problems  Anxiety Disorder High blood pressure Hypercholesterolemia Lump In Breast Thyroid Disease  Past Surgical History  Breast Biopsy Right. Cataract Surgery Right.  Diagnostic Studies History  Colonoscopy 1-5 years ago Mammogram within last year Pap Smear 1-5 years ago  Medication History  No Current Medications Medications Reconciled  Social History  Alcohol use Occasional alcohol use. Caffeine use Carbonated beverages. No drug use Tobacco use Former smoker.  Family History  Colon Polyps Mother. Diabetes Mellitus Family Members In General, Father, Mother, Sister. Heart Disease Father, Sister. Heart disease in female family member before age 35 Heart disease in female family member before age 74 Hypertension Father, Sister. Migraine Headache Daughter, Father.  Pregnancy / Birth History  Age at menarche 41 years. Age of menopause 49-55 Contraceptive History Oral contraceptives. Gravida 1 Maternal age 36-30 Para 1    Review of Systems General Not Present- Appetite Loss, Chills, Fatigue, Fever, Night Sweats, Weight Gain and Weight Loss. Skin Present- Dryness. Not Present- Change in Wart/Mole, Hives, Jaundice, New Lesions, Non-Healing Wounds, Rash and Ulcer. HEENT Present- Ringing in the Ears and Wears glasses/contact lenses. Not Present- Earache, Hearing Loss, Hoarseness, Nose Bleed, Oral Ulcers, Seasonal Allergies, Sinus Pain, Sore Throat, Visual Disturbances and Yellow Eyes. Respiratory Not Present- Bloody sputum, Chronic Cough, Difficulty Breathing, Snoring and Wheezing. Breast Present- Breast Mass. Not Present- Breast Pain, Nipple Discharge and Skin Changes. Cardiovascular Not Present- Chest Pain, Difficulty Breathing Lying Down, Leg Cramps, Palpitations, Rapid Heart Rate, Shortness of  Breath and  Swelling of Extremities. Gastrointestinal Not Present- Abdominal Pain, Bloating, Bloody Stool, Change in Bowel Habits, Chronic diarrhea, Constipation, Difficulty Swallowing, Excessive gas, Gets full quickly at meals, Hemorrhoids, Indigestion, Nausea, Rectal Pain and Vomiting. Female Genitourinary Not Present- Frequency, Nocturia, Painful Urination, Pelvic Pain and Urgency. Musculoskeletal Not Present- Back Pain, Joint Pain, Joint Stiffness, Muscle Pain, Muscle Weakness and Swelling of Extremities. Neurological Not Present- Decreased Memory, Fainting, Headaches, Numbness, Seizures, Tingling, Tremor, Trouble walking and Weakness. Psychiatric Present- Anxiety. Not Present- Bipolar, Change in Sleep Pattern, Depression, Fearful and Frequent crying. Endocrine Not Present- Cold Intolerance, Excessive Hunger, Hair Changes, Heat Intolerance, Hot flashes and New Diabetes. Hematology Not Present- Easy Bruising, Excessive bleeding, Gland problems, HIV and Persistent Infections.   Physical Exam  General Mental Status-Alert. General Appearance-Consistent with stated age. Hydration-Well hydrated. Voice-Normal.  Head and Neck Head-normocephalic, atraumatic with no lesions or palpable masses. Trachea-midline. Thyroid Gland Characteristics - normal size and consistency.  Eye Eyeball - Bilateral-Extraocular movements intact. Sclera/Conjunctiva - Bilateral-No scleral icterus.  Chest and Lung Exam Chest and lung exam reveals -quiet, even and easy respiratory effort with no use of accessory muscles and on auscultation, normal breath sounds, no adventitious sounds and normal vocal resonance. Inspection Chest Wall - Normal. Back - normal.  Breast Note: Breast are moderately large. Tiny bruise right breast lower outer quadrant. I can palpate a small mass up against the chest wall when I move the breast superiorly and medially. This may be the cancer or maybe a small hematoma. There is no  other mass or skin change in either breast. There is no axillary adenopathy on either side.   Cardiovascular Cardiovascular examination reveals -normal heart sounds, regular rate and rhythm with no murmurs and normal pedal pulses bilaterally.  Abdomen Inspection Inspection of the abdomen reveals - No Hernias. Skin - Scar - no surgical scars. Palpation/Percussion Palpation and Percussion of the abdomen reveal - Soft, Non Tender, No Rebound tenderness, No Rigidity (guarding) and No hepatosplenomegaly. Auscultation Auscultation of the abdomen reveals - Bowel sounds normal.  Neurologic Neurologic evaluation reveals -alert and oriented x 3 with no impairment of recent or remote memory. Mental Status-Normal.  Musculoskeletal Normal Exam - Left-Upper Extremity Strength Normal and Lower Extremity Strength Normal. Normal Exam - Right-Upper Extremity Strength Normal and Lower Extremity Strength Normal.  Lymphatic Head & Neck  General Head & Neck Lymphatics: Bilateral - Description - Normal. Axillary  General Axillary Region: Bilateral - Description - Normal. Tenderness - Non Tender. Femoral & Inguinal  Generalized Femoral & Inguinal Lymphatics: Bilateral - Description - Normal. Tenderness - Non Tender.    Assessment & Plan BREAST CANCER OF LOWER-OUTER QUADRANT OF RIGHT FEMALE BREAST (C50.511)  Your imaging studies and recent biopsy confirm a small invasive ductal carcinoma of the right breast, lower outer quadrant. The tumor is estrogen and progesterone receptor positive, HER-2 negative We have discussed options for definitive surgical management including lumpectomy with radiation therapy, sentinel node biopsy, mastectomy with or without reconstruction We have decided to proceed with right partial mastectomy with radioactive seed localization and right axillary sentinel lymph node biopsy. I think you are an excellent candidate for that We have discussed the indications,  techniques, and numerous risk of the surgery in detail Dr. Darrel Hoover office will call you tomorrow to begin the scheduling procAfter you have recovered from surgery you'll be offered radiation therapy. Dr. Lindi Adie will decide whether you need antiestrogen pills or chemotherapy.  HYPERTENSION, BENIGN (I10) GLAUCOMA (H40.9) FORMER SMOKER (V36.122)  [name]. [name], M.D., [facility] General and Minimally invasive Surgery Breast and Colorectal Surgery Office:   [phone] Pager:   [phone]

## 2015-06-19 NOTE — Anesthesia Preprocedure Evaluation (Addendum)
Anesthesia Evaluation  Patient identified by MRN, date of birth, ID band Patient awake    Reviewed: Allergy & Precautions, NPO status , Patient's Chart, lab work & pertinent test results  Airway Mallampati: II  TM Distance: >3 FB Neck ROM: Full    Dental  (+) Dental Advisory Given, Loose, Missing   Pulmonary former smoker,    breath sounds clear to auscultation       Cardiovascular hypertension, Pt. on medications  Rhythm:Regular Rate:Normal     Neuro/Psych Anxiety negative neurological ROS     GI/Hepatic negative GI ROS, Neg liver ROS,   Endo/Other  Hypothyroidism   Renal/GU negative Renal ROS     Musculoskeletal negative musculoskeletal ROS (+)   Abdominal   Peds  Hematology negative hematology ROS (+)   Anesthesia Other Findings   Reproductive/Obstetrics                            Lab Results  Component Value Date   WBC 7.6 06/01/2015   HGB 14.1 06/01/2015   HCT 40.7 06/01/2015   MCV 87.7 06/01/2015   PLT 280 06/01/2015   Lab Results  Component Value Date   CREATININE 1.00 06/13/2015   BUN 17 06/13/2015   NA 136 06/13/2015   K 3.8 06/13/2015   CL 96* 06/13/2015   CO2 28 06/13/2015    Anesthesia Physical Anesthesia Plan  ASA: II  Anesthesia Plan: General and Regional   Post-op Pain Management: GA combined w/ Regional for post-op pain   Induction: Intravenous  Airway Management Planned: LMA  Additional Equipment:   Intra-op Plan:   Post-operative Plan: Extubation in OR  Informed Consent: I have reviewed the patients History and Physical, chart, labs and discussed the procedure including the risks, benefits and alternatives for the proposed anesthesia with the patient or authorized representative who has indicated his/her understanding and acceptance.   Dental advisory given  Plan Discussed with: CRNA  Anesthesia Plan Comments:        Anesthesia Quick  Evaluation

## 2015-06-20 ENCOUNTER — Ambulatory Visit
Admission: RE | Admit: 2015-06-20 | Discharge: 2015-06-20 | Disposition: A | Discharge status: Home / Self Care | Payer: Medicare Other | Source: Ambulatory Visit | Attending: General Surgery | Admitting: General Surgery

## 2015-06-20 ENCOUNTER — Ambulatory Visit (HOSPITAL_BASED_OUTPATIENT_CLINIC_OR_DEPARTMENT_OTHER): Payer: Medicare Other | Admitting: Anesthesiology

## 2015-06-20 ENCOUNTER — Encounter (HOSPITAL_BASED_OUTPATIENT_CLINIC_OR_DEPARTMENT_OTHER): Payer: Self-pay

## 2015-06-20 ENCOUNTER — Encounter (HOSPITAL_BASED_OUTPATIENT_CLINIC_OR_DEPARTMENT_OTHER)
Admission: RE | Disposition: A | Discharge status: Home / Self Care | Payer: Self-pay | Source: Ambulatory Visit | Attending: General Surgery

## 2015-06-20 ENCOUNTER — Ambulatory Visit (HOSPITAL_COMMUNITY)
Admission: RE | Admit: 2015-06-20 | Discharge: 2015-06-20 | Disposition: A | Discharge status: Home / Self Care | Payer: Medicare Other | Source: Ambulatory Visit | Attending: General Surgery | Admitting: General Surgery

## 2015-06-20 ENCOUNTER — Ambulatory Visit (HOSPITAL_BASED_OUTPATIENT_CLINIC_OR_DEPARTMENT_OTHER)
Admission: RE | Admit: 2015-06-20 | Discharge: 2015-06-20 | Disposition: A | Discharge status: Home / Self Care | Payer: Medicare Other | Source: Ambulatory Visit | Attending: General Surgery | Admitting: General Surgery

## 2015-06-20 DIAGNOSIS — Z87891 Personal history of nicotine dependence: Secondary | ICD-10-CM | POA: Diagnosis not present

## 2015-06-20 DIAGNOSIS — I10 Essential (primary) hypertension: Secondary | ICD-10-CM | POA: Diagnosis not present

## 2015-06-20 DIAGNOSIS — E039 Hypothyroidism, unspecified: Secondary | ICD-10-CM | POA: Diagnosis not present

## 2015-06-20 DIAGNOSIS — F419 Anxiety disorder, unspecified: Secondary | ICD-10-CM | POA: Diagnosis not present

## 2015-06-20 DIAGNOSIS — C50511 Malignant neoplasm of lower-outer quadrant of right female breast: Secondary | ICD-10-CM

## 2015-06-20 HISTORY — PX: RADIOACTIVE SEED GUIDED PARTIAL MASTECTOMY WITH AXILLARY SENTINEL LYMPH NODE BIOPSY: SHX6520

## 2015-06-20 HISTORY — DX: Hypothyroidism, unspecified: E03.9

## 2015-06-20 HISTORY — PX: BREAST LUMPECTOMY: SHX2

## 2015-06-20 SURGERY — RADIOACTIVE SEED GUIDED PARTIAL MASTECTOMY WITH AXILLARY SENTINEL LYMPH NODE BIOPSY
Anesthesia: Regional | Site: Breast | Laterality: Right

## 2015-06-20 MED ORDER — TECHNETIUM TC 99M SULFUR COLLOID FILTERED
1.0000 | Freq: Once | INTRAVENOUS | Status: AC | PRN
Start: 2015-06-20 — End: 2015-06-20
  Administered 2015-06-20: 1 via INTRADERMAL

## 2015-06-20 MED ORDER — BUPIVACAINE-EPINEPHRINE (PF) 0.5% -1:200000 IJ SOLN
INTRAMUSCULAR | Status: DC | PRN
  Administered 2015-06-20: 10 mL

## 2015-06-20 MED ORDER — FENTANYL CITRATE (PF) 100 MCG/2ML IJ SOLN
INTRAMUSCULAR | Status: AC
  Filled 2015-06-20: qty 2

## 2015-06-20 MED ORDER — LACTATED RINGERS IV SOLN
INTRAVENOUS | Status: DC
  Administered 2015-06-20 (×3): via INTRAVENOUS

## 2015-06-20 MED ORDER — EPHEDRINE SULFATE 50 MG/ML IJ SOLN
INTRAMUSCULAR | Status: AC
  Filled 2015-06-20: qty 1

## 2015-06-20 MED ORDER — MIDAZOLAM HCL 2 MG/2ML IJ SOLN
1.0000 mg | INTRAMUSCULAR | Status: DC | PRN
  Administered 2015-06-20 (×2): 1 mg via INTRAVENOUS

## 2015-06-20 MED ORDER — CHLORHEXIDINE GLUCONATE 4 % EX LIQD: 1.0000 "application " | Freq: Once | CUTANEOUS | Status: DC

## 2015-06-20 MED ORDER — CEFAZOLIN SODIUM-DEXTROSE 2-4 GM/100ML-% IV SOLN
2.0000 g | INTRAVENOUS | Status: AC
  Administered 2015-06-20: 2 g via INTRAVENOUS

## 2015-06-20 MED ORDER — PROMETHAZINE HCL 25 MG/ML IJ SOLN: 6.2500 mg | INTRAMUSCULAR | Status: DC | PRN

## 2015-06-20 MED ORDER — MIDAZOLAM HCL 2 MG/2ML IJ SOLN
INTRAMUSCULAR | Status: AC
  Filled 2015-06-20: qty 2

## 2015-06-20 MED ORDER — HYDROCODONE-ACETAMINOPHEN 5-325 MG PO TABS: 1.0000 | ORAL_TABLET | Freq: Four times a day (QID) | ORAL | Status: DC | PRN

## 2015-06-20 MED ORDER — HYDROCODONE-ACETAMINOPHEN 7.5-325 MG PO TABS: 1.0000 | ORAL_TABLET | Freq: Once | ORAL | Status: DC | PRN

## 2015-06-20 MED ORDER — MIDAZOLAM HCL 5 MG/5ML IJ SOLN
INTRAMUSCULAR | Status: DC | PRN
  Administered 2015-06-20: 2 mg via INTRAVENOUS

## 2015-06-20 MED ORDER — BUPIVACAINE-EPINEPHRINE (PF) 0.5% -1:200000 IJ SOLN
INTRAMUSCULAR | Status: DC | PRN
  Administered 2015-06-20: 25 mL

## 2015-06-20 MED ORDER — LIDOCAINE HCL (CARDIAC) 20 MG/ML IV SOLN
INTRAVENOUS | Status: DC | PRN
  Administered 2015-06-20: 50 mg via INTRAVENOUS

## 2015-06-20 MED ORDER — PHENYLEPHRINE HCL 10 MG/ML IJ SOLN
INTRAMUSCULAR | Status: DC | PRN
  Administered 2015-06-20 (×9): 80 ug via INTRAVENOUS

## 2015-06-20 MED ORDER — FENTANYL CITRATE (PF) 100 MCG/2ML IJ SOLN
INTRAMUSCULAR | Status: DC | PRN
  Administered 2015-06-20: 100 ug via INTRAVENOUS

## 2015-06-20 MED ORDER — ONDANSETRON HCL 4 MG/2ML IJ SOLN
INTRAMUSCULAR | Status: AC
  Filled 2015-06-20: qty 2

## 2015-06-20 MED ORDER — GLYCOPYRROLATE 0.2 MG/ML IJ SOLN: 0.2000 mg | Freq: Once | INTRAMUSCULAR | Status: DC | PRN

## 2015-06-20 MED ORDER — DEXAMETHASONE SODIUM PHOSPHATE 4 MG/ML IJ SOLN
INTRAMUSCULAR | Status: DC | PRN
  Administered 2015-06-20: 10 mg via INTRAVENOUS

## 2015-06-20 MED ORDER — SODIUM CHLORIDE 0.9 % IJ SOLN
INTRAMUSCULAR | Status: AC
  Filled 2015-06-20: qty 10

## 2015-06-20 MED ORDER — FENTANYL CITRATE (PF) 100 MCG/2ML IJ SOLN
50.0000 ug | INTRAMUSCULAR | Status: DC | PRN
  Administered 2015-06-20 (×2): 50 ug via INTRAVENOUS

## 2015-06-20 MED ORDER — PROPOFOL 10 MG/ML IV BOLUS
INTRAVENOUS | Status: DC | PRN
  Administered 2015-06-20: 150 mg via INTRAVENOUS

## 2015-06-20 MED ORDER — SCOPOLAMINE 1 MG/3DAYS TD PT72: 1.0000 | MEDICATED_PATCH | Freq: Once | TRANSDERMAL | Status: DC | PRN

## 2015-06-20 MED ORDER — PHENYLEPHRINE 40 MCG/ML (10ML) SYRINGE FOR IV PUSH (FOR BLOOD PRESSURE SUPPORT)
PREFILLED_SYRINGE | INTRAVENOUS | Status: AC
  Filled 2015-06-20: qty 10

## 2015-06-20 MED ORDER — PROPOFOL 10 MG/ML IV BOLUS
INTRAVENOUS | Status: AC
  Filled 2015-06-20: qty 40

## 2015-06-20 MED ORDER — DEXAMETHASONE SODIUM PHOSPHATE 10 MG/ML IJ SOLN
INTRAMUSCULAR | Status: AC
  Filled 2015-06-20: qty 1

## 2015-06-20 MED ORDER — HYDROMORPHONE HCL 1 MG/ML IJ SOLN: 0.2500 mg | INTRAMUSCULAR | Status: DC | PRN

## 2015-06-20 MED ORDER — 0.9 % SODIUM CHLORIDE (POUR BTL) OPTIME
TOPICAL | Status: DC | PRN
  Administered 2015-06-20: 1000 mL

## 2015-06-20 MED ORDER — CEFAZOLIN SODIUM-DEXTROSE 2-4 GM/100ML-% IV SOLN
INTRAVENOUS | Status: AC
  Filled 2015-06-20: qty 100

## 2015-06-20 MED ORDER — SODIUM CHLORIDE 0.9 % IJ SOLN
INTRAMUSCULAR | Status: DC | PRN
  Administered 2015-06-20: 5 mL

## 2015-06-20 MED ORDER — EPHEDRINE SULFATE 50 MG/ML IJ SOLN
INTRAMUSCULAR | Status: DC | PRN
  Administered 2015-06-20 (×3): 10 mg via INTRAVENOUS

## 2015-06-20 MED ORDER — ONDANSETRON HCL 4 MG/2ML IJ SOLN
INTRAMUSCULAR | Status: DC | PRN
  Administered 2015-06-20: 4 mg via INTRAVENOUS

## 2015-06-20 MED ORDER — LIDOCAINE HCL (CARDIAC) 20 MG/ML IV SOLN
INTRAVENOUS | Status: AC
  Filled 2015-06-20: qty 5

## 2015-06-20 SURGICAL SUPPLY — 61 items
APPLIER CLIP 9.375 MED OPEN (MISCELLANEOUS) ×3
BENZOIN TINCTURE PRP APPL 2/3 (GAUZE/BANDAGES/DRESSINGS) IMPLANT
BINDER BREAST LRG (GAUZE/BANDAGES/DRESSINGS) IMPLANT
BINDER BREAST MEDIUM (GAUZE/BANDAGES/DRESSINGS) IMPLANT
BINDER BREAST XLRG (GAUZE/BANDAGES/DRESSINGS) ×3 IMPLANT
BINDER BREAST XXLRG (GAUZE/BANDAGES/DRESSINGS) IMPLANT
BLADE HEX COATED 2.75 (ELECTRODE) ×6 IMPLANT
BLADE SURG 10 STRL SS (BLADE) IMPLANT
BLADE SURG 15 STRL LF DISP TIS (BLADE) ×1 IMPLANT
BLADE SURG 15 STRL SS (BLADE) ×2
CANISTER SUC SOCK COL 7IN (MISCELLANEOUS) IMPLANT
CANISTER SUCT 1200ML W/VALVE (MISCELLANEOUS) ×3 IMPLANT
CHLORAPREP W/TINT 26ML (MISCELLANEOUS) ×3 IMPLANT
CLIP APPLIE 9.375 MED OPEN (MISCELLANEOUS) ×1 IMPLANT
CLOSURE WOUND 1/2 X4 (GAUZE/BANDAGES/DRESSINGS)
COVER BACK TABLE 60X90IN (DRAPES) ×3 IMPLANT
COVER MAYO STAND STRL (DRAPES) ×3 IMPLANT
COVER PROBE W GEL 5X96 (DRAPES) ×3 IMPLANT
DECANTER SPIKE VIAL GLASS SM (MISCELLANEOUS) IMPLANT
DERMABOND ADVANCED (GAUZE/BANDAGES/DRESSINGS) ×2
DERMABOND ADVANCED .7 DNX12 (GAUZE/BANDAGES/DRESSINGS) ×1 IMPLANT
DEVICE DUBIN W/COMP PLATE 8390 (MISCELLANEOUS) ×3 IMPLANT
DRAPE LAPAROSCOPIC ABDOMINAL (DRAPES) ×3 IMPLANT
DRAPE UTILITY XL STRL (DRAPES) ×3 IMPLANT
DRSG PAD ABDOMINAL 8X10 ST (GAUZE/BANDAGES/DRESSINGS) ×3 IMPLANT
ELECT REM PT RETURN 9FT ADLT (ELECTROSURGICAL) ×3
ELECTRODE REM PT RTRN 9FT ADLT (ELECTROSURGICAL) ×1 IMPLANT
GAUZE SPONGE 4X4 12PLY STRL (GAUZE/BANDAGES/DRESSINGS) ×3 IMPLANT
GLOVE BIOGEL PI IND STRL 7.0 (GLOVE) ×2 IMPLANT
GLOVE BIOGEL PI INDICATOR 7.0 (GLOVE) ×4
GLOVE ECLIPSE 6.5 STRL STRAW (GLOVE) ×3 IMPLANT
GLOVE EUDERMIC 7 POWDERFREE (GLOVE) ×6 IMPLANT
GOWN STRL REUS W/ TWL LRG LVL3 (GOWN DISPOSABLE) ×2 IMPLANT
GOWN STRL REUS W/ TWL XL LVL3 (GOWN DISPOSABLE) ×1 IMPLANT
GOWN STRL REUS W/TWL LRG LVL3 (GOWN DISPOSABLE) ×4
GOWN STRL REUS W/TWL XL LVL3 (GOWN DISPOSABLE) ×2
KIT MARKER MARGIN INK (KITS) ×3 IMPLANT
NDL SAFETY ECLIPSE 18X1.5 (NEEDLE) ×1 IMPLANT
NEEDLE HYPO 18GX1.5 SHARP (NEEDLE) ×2
NEEDLE HYPO 25X1 1.5 SAFETY (NEEDLE) ×6 IMPLANT
NS IRRIG 1000ML POUR BTL (IV SOLUTION) ×3 IMPLANT
PACK BASIN DAY SURGERY FS (CUSTOM PROCEDURE TRAY) ×3 IMPLANT
PENCIL BUTTON HOLSTER BLD 10FT (ELECTRODE) ×6 IMPLANT
SHEET MEDIUM DRAPE 40X70 STRL (DRAPES) ×3 IMPLANT
SLEEVE SCD COMPRESS KNEE MED (MISCELLANEOUS) ×3 IMPLANT
SPONGE LAP 18X18 X RAY DECT (DISPOSABLE) IMPLANT
SPONGE LAP 4X18 X RAY DECT (DISPOSABLE) ×3 IMPLANT
STRIP CLOSURE SKIN 1/2X4 (GAUZE/BANDAGES/DRESSINGS) IMPLANT
SUT MNCRL AB 4-0 PS2 18 (SUTURE) ×6 IMPLANT
SUT SILK 2 0 SH (SUTURE) ×3 IMPLANT
SUT VIC AB 2-0 CT1 27 (SUTURE)
SUT VIC AB 2-0 CT1 TAPERPNT 27 (SUTURE) IMPLANT
SUT VIC AB 3-0 SH 27 (SUTURE)
SUT VIC AB 3-0 SH 27X BRD (SUTURE) IMPLANT
SUT VICRYL 3-0 CR8 SH (SUTURE) ×3 IMPLANT
SYRINGE 10CC LL (SYRINGE) ×6 IMPLANT
TOWEL OR 17X24 6PK STRL BLUE (TOWEL DISPOSABLE) ×3 IMPLANT
TOWEL OR NON WOVEN STRL DISP B (DISPOSABLE) ×3 IMPLANT
TUBE CONNECTING 20'X1/4 (TUBING) ×1
TUBE CONNECTING 20X1/4 (TUBING) ×2 IMPLANT
YANKAUER SUCT BULB TIP NO VENT (SUCTIONS) ×3 IMPLANT

## 2015-06-20 NOTE — Progress Notes (Signed)
Assisted Dr. Rob Fitzgerald with right, ultrasound guided, pectoralis block. Side rails up, monitors on throughout procedure. See vital signs in flow sheet. Tolerated Procedure well. 

## 2015-06-20 NOTE — Interval H&P Note (Signed)
History and Physical Interval Note:  06/20/2015 7:02 AM  Tiffany Thompson  has presented today for surgery, with the diagnosis of RIGHT BREAST CANCER  The various methods of treatment have been discussed with the patient and family. After consideration of risks, benefits and other options for treatment, the patient has consented to  Procedure(s): RIGHT BREAST RADIOACTIVE SEED GUIDED PARTIAL MASTECTOMY WITH BLUE DYE INJECTION OF RIGHT BREAST AND RIGHT AXILLARY SENTINEL LYMPH NODE BIOPSY (Right) as a surgical intervention .  The patient's history has been reviewed, patient examined, no change in status, stable for surgery.  I have reviewed the patient's chart and labs.  Questions were answered to the patient's satisfaction.     Adin Hector

## 2015-06-20 NOTE — Op Note (Signed)
Patient Name:           Tiffany Thompson   Date of Surgery:        06/20/2015  Pre op Diagnosis:      Invasive cancer right breast, lower outer quadrant; stage cT1c, N0 (IA)  Post op Diagnosis:    Same  Procedure:                 Inject blue dye right breast, right partial mastectomy with radioactive seed localization, right axillary sentinel node biopsy  Surgeon:                     [name]. [name], M.D., FACS  Assistant:                      OR staff  Operative Indications:   This is a 65 year old Caucasian female, referred by Dr. Curlene Dolphin at the breast center of [facility] for evaluation and management of a 1 cm invasive ductal carcinoma of the right breast, lower outer quadrant. Dr. Harlene Ramus is her PCP. She was evaluated in the [facility] by Dr. Lindi Adie, Dr. [name], and me.      She has never had a breast problem in the past. Recent screening screening mammogram and ultrasound show a 10 mm spiculated mass in the lower outer quadrant of the right breast at the 8:00 position. Image guided biopsy shows invasive ductal carcinoma and some DCIS. Hormone receptor strongly positive, Ki-67 10%. HER-2 negative.      Family history is negative for breast, ovarian, or pancreatic cancer.       We discussed her case in breast conference. I have coordinated her care plan with Dr. [name] and Dr. Lindi Adie. She is strongly motivated for lumpectomy and breast conservation, and I think she is a good candidate for that.   Operative Findings:       I was able to hear the I-125 radioactive signal in the right breast, lower outer quadrant in the holding area using the neoprobe.  A pectoral block was performed by the anesthesiologist in the holding area.  Technetium 99 radionuclide was injected by the nuclear medicine technician in the holding area.  In the operating room the specimen mammogram looked good with the marker clip and the radioactive seed in the center of the specimen.  There was no palpable  abnormality.  In the right axilla the radionuclide did not map very well at all.  Fortunately, the blue dye did map and I was able to find 2 clear-cut sentinel nodes by tracing the blue lymphatics and I sent a third area that was suspicious.  Procedure in Detail:          Following the induction of general LMA anesthesia a surgical timeout was performed.  Intravenous antibiotics were given.  Following alcohol prep I injected 5 mL of blue dye into the right breast, subareolar area and massaged the breast for a few minutes.  This was methylene blue mixed with saline.      The right chest wall and axilla were prepped and draped in a sterile fashion.  0.5% Marcaine with epinephrine was used as local infiltration anesthetic into the skin and subcutaneous tissue.  Using the neoprobe identified the area of maximum radioactivity in the right breast lower outer quadrant 8:00 position.  Incision was made in this area, radially oriented.  Using the neoprobe I dissected down and around the radioactive signal.  The specimen was removed  and marked with silk sutures and a 6 color ink kit to  orient the pathologist.  Specimen mammogram looked very good as mentioned above.  Hemostasis was excellent and achieved with electrocautery.  The wound was irrigated with saline.  The lumpectomy  cavity was marked with titanium metal clips.  The breast tissues were closed in several layers with interrupted sutures of 3-0 Vicryl and skin closed with a running subcutaneous taken of 4-0 Monocryl and Dermabond.      Attention was directed to the right axilla.  As mentioned above there was very little radioactive signal.  I made a transverse incision in the right axilla at the hairline.  Dissection was carried down through the subcutaneous tissue and the clavipectoral fascia was incised.  Entering the axilla I found 2 blue lymphatics and traced these out and found 2 sentinel lymph nodes.  There is a third area that I removed but there really  was no radioactivity or blue dye in that.  These were all sent for routine histology.  Hemostasis in the axilla was excellent and achieved with electrocautery.  The wound was irrigated with saline.  The deeper tissues were closed with 3-0 Vicryl sutures and the skin closed with running subcuticular 4-0 Monocryl and Dermabond.  Dry bandages and a breast binder were placed.  The patient tolerated the procedure well was taken to PACU in stable condition.  EBL 20 mL or less.  Counts correct.  Complications none.      [name]. [name], M.D., FACS General and Minimally Invasive Surgery Breast and Colorectal Surgery  06/20/2015 9:05 AM

## 2015-06-20 NOTE — Discharge Instructions (Signed)
[facility] °Office Phone Number [phone] ° °BREAST BIOPSY/ PARTIAL MASTECTOMY: POST OP INSTRUCTIONS ° °Always review your discharge instruction sheet given to you by the facility where your surgery was performed. ° °IF YOU HAVE DISABILITY OR FAMILY LEAVE FORMS, YOU MUST BRING THEM TO THE OFFICE FOR PROCESSING.  DO NOT GIVE THEM TO YOUR DOCTOR. ° °1. A prescription for pain medication may be given to you upon discharge.  Take your pain medication as prescribed, if needed.  If narcotic pain medicine is not needed, then you may take acetaminophen (Tylenol) or ibuprofen (Advil) as needed. °2. Take your usually prescribed medications unless otherwise directed °3. If you need a refill on your pain medication, please contact your pharmacy.  They will contact our office to request authorization.  Prescriptions will not be filled after 5pm or on week-ends. °4. You should eat very light the first 24 hours after surgery, such as soup, crackers, pudding, etc.  Resume your normal diet the day after surgery. °5. Most patients will experience some swelling and bruising in the breast.  Ice packs and a good support bra will help.  Swelling and bruising can take several days to resolve.  °6. It is common to experience some constipation if taking pain medication after surgery.  Increasing fluid intake and taking a stool softener will usually help or prevent this problem from occurring.  A mild laxative (Milk of Magnesia or Miralax) should be taken according to package directions if there are no bowel movements after 48 hours. °7. Unless discharge instructions indicate otherwise, you may remove your bandages 24-48 hours after surgery, and you may shower at that time.  You may have steri-strips (small skin tapes) in place directly over the incision.  These strips should be left on the skin for 7-10 days.  If your surgeon used skin glue on the incision, you may shower in 24 hours.  The glue will flake off over the  next 2-3 weeks.  Any sutures or staples will be removed at the office during your follow-up visit. °8. ACTIVITIES:  You may resume regular daily activities (gradually increasing) beginning the next day.  Wearing a good support bra or sports bra minimizes pain and swelling.  You may have sexual intercourse when it is comfortable. °a. You may drive when you no longer are taking prescription pain medication, you can comfortably wear a seatbelt, and you can safely maneuver your car and apply brakes. °b. RETURN TO WORK:  ______________________________________________________________________________________ °9. You should see your doctor in the office for a follow-up appointment approximately two weeks after your surgery.  Your doctor’s nurse will typically make your follow-up appointment when she calls you with your pathology report.  Expect your pathology report 2-3 business days after your surgery.  You may call to check if you do not hear from us after three days. °10. OTHER INSTRUCTIONS: _______________________________________________________________________________________________ _____________________________________________________________________________________________________________________________________ °_____________________________________________________________________________________________________________________________________ °_____________________________________________________________________________________________________________________________________ ° °WHEN TO CALL YOUR DOCTOR: °1. Fever over 101.0 °2. Nausea and/or vomiting. °3. Extreme swelling or bruising. °4. Continued bleeding from incision. °5. Increased pain, redness, or drainage from the incision. ° °The clinic staff is available to answer your questions during regular business hours.  Please don’t hesitate to call and ask to speak to one of the nurses for clinical concerns.  If you have a medical emergency, go to the nearest  emergency room or call 911.  A surgeon from [facility] is always on call at the hospital. ° °For further questions, please visit [email or web address]  ° ° ° °  Post Anesthesia Home Care Instructions ° °Activity: °Get plenty of rest for the remainder of the day. A responsible adult should stay with you for 24 hours following the procedure.  °For the next 24 hours, DO NOT: °-Drive a car °-Operate machinery °-Drink alcoholic beverages °-Take any medication unless instructed by your physician °-Make any legal decisions or sign important papers. ° °Meals: °Start with liquid foods such as gelatin or soup. Progress to regular foods as tolerated. Avoid greasy, spicy, heavy foods. If nausea and/or vomiting occur, drink only clear liquids until the nausea and/or vomiting subsides. Call your physician if vomiting continues. ° °Special Instructions/Symptoms: °Your throat may feel dry or sore from the anesthesia or the breathing tube placed in your throat during surgery. If this causes discomfort, gargle with warm salt water. The discomfort should disappear within 24 hours. ° °If you had a scopolamine patch placed behind your ear for the management of post- operative nausea and/or vomiting: ° °1. The medication in the patch is effective for 72 hours, after which it should be removed.  Wrap patch in a tissue and discard in the trash. Wash hands thoroughly with soap and water. °2. You may remove the patch earlier than 72 hours if you experience unpleasant side effects which may include dry mouth, dizziness or visual disturbances. °3. Avoid touching the patch. Wash your hands with soap and water after contact with the patch. °  ° °

## 2015-06-20 NOTE — Anesthesia Postprocedure Evaluation (Signed)
Anesthesia Post Note  Patient: NEOSHA SHIMEL  Procedure(s) Performed: Procedure(s) (LRB): RIGHT BREAST RADIOACTIVE SEED GUIDED PARTIAL MASTECTOMY WITH BLUE DYE INJECTION OF RIGHT BREAST AND RIGHT AXILLARY SENTINEL LYMPH NODE BIOPSY (Right)  Patient location during evaluation: PACU Anesthesia Type: General and Regional Level of consciousness: awake and alert Pain management: pain level controlled Vital Signs Assessment: post-procedure vital signs reviewed and stable Respiratory status: spontaneous breathing, nonlabored ventilation and respiratory function stable Cardiovascular status: blood pressure returned to baseline and stable Postop Assessment: no signs of nausea or vomiting Anesthetic complications: no    Last Vitals:  Filed Vitals:   06/20/15 1030 06/20/15 1105  BP: 100/79 113/81  Pulse: 79 86  Temp:  36.4 C  Resp: 16 16    Last Pain:  Filed Vitals:   06/20/15 1110  PainSc: 1                  Tiajuana Amass

## 2015-06-20 NOTE — Anesthesia Procedure Notes (Addendum)
Anesthesia Regional Block:  Pectoralis block  Pre-Anesthetic Checklist: ,, timeout performed, Correct Patient, Correct Site, Correct Laterality, Correct Procedure, Correct Position, site marked, Risks and benefits discussed,  Surgical consent,  Pre-op evaluation,  At surgeon's request and post-op pain management  Laterality: Right  Prep: chloraprep       Needles:   Needle Type: Echogenic Needle     Needle Length: 9cm 9 cm Needle Gauge: 21 and 21 G    Additional Needles:  Procedures: ultrasound guided (picture in chart) Pectoralis block Narrative:  Start time: 06/20/2015 7:20 AM End time: 06/20/2015 7:30 AM Injection made incrementally with aspirations every 5 mL.  Performed by: Personally  Anesthesiologist: Suzette Battiest   Procedure Name: LMA Insertion Date/Time: 06/20/2015 7:49 AM Performed by: Marrianne Mood Pre-anesthesia Checklist: Patient identified, Emergency Drugs available, Suction available, Patient being monitored and Timeout performed Patient Re-evaluated:Patient Re-evaluated prior to inductionOxygen Delivery Method: Circle System Utilized Preoxygenation: Pre-oxygenation with 100% oxygen Intubation Type: IV induction Ventilation: Mask ventilation without difficulty LMA: LMA inserted LMA Size: 4.0 Number of attempts: 1 Airway Equipment and Method: Bite block Placement Confirmation: positive ETCO2 Tube secured with: Tape Dental Injury: Teeth and Oropharynx as per pre-operative assessment

## 2015-06-20 NOTE — Transfer of Care (Signed)
Immediate Anesthesia Transfer of Care Note  Patient: Tiffany Thompson  Procedure(s) Performed: Procedure(s): RIGHT BREAST RADIOACTIVE SEED GUIDED PARTIAL MASTECTOMY WITH BLUE DYE INJECTION OF RIGHT BREAST AND RIGHT AXILLARY SENTINEL LYMPH NODE BIOPSY (Right)  Patient Location: PACU  Anesthesia Type:GA combined with regional for post-op pain  Level of Consciousness: sedated  Airway & Oxygen Therapy: Patient Spontanous Breathing and Patient connected to face mask oxygen  Post-op Assessment: Report given to RN and Post -op Vital signs reviewed and stable  Post vital signs: Reviewed and stable  Last Vitals:  Filed Vitals:   06/20/15 0909 06/20/15 0910  BP: 95/74   Pulse: 79 80  Temp:    Resp:  12    Complications: No apparent anesthesia complications

## 2015-06-21 ENCOUNTER — Encounter (HOSPITAL_BASED_OUTPATIENT_CLINIC_OR_DEPARTMENT_OTHER): Payer: Self-pay | Admitting: General Surgery

## 2015-06-23 ENCOUNTER — Other Ambulatory Visit: Payer: Self-pay | Admitting: General Surgery

## 2015-06-27 ENCOUNTER — Ambulatory Visit: Payer: No Typology Code available for payment source | Admitting: Hematology and Oncology

## 2015-06-27 ENCOUNTER — Other Ambulatory Visit: Payer: Self-pay | Admitting: *Deleted

## 2015-06-27 ENCOUNTER — Telehealth: Payer: Self-pay | Admitting: Hematology and Oncology

## 2015-06-27 NOTE — Telephone Encounter (Signed)
lvm to inform patient of r/s 5/3 appt to 5/10 at 315 pm

## 2015-06-28 ENCOUNTER — Encounter: Payer: Self-pay | Admitting: *Deleted

## 2015-06-28 ENCOUNTER — Encounter (HOSPITAL_COMMUNITY): Payer: Self-pay | Admitting: *Deleted

## 2015-06-28 NOTE — Progress Notes (Signed)
Ordered Oncotype Dx test per Dr. Yvette Rack.  Faxed request to [facility] and called Tammy to confirm she received it.  Faxed request to [facility].  Placed a note to look for results.

## 2015-06-28 NOTE — H&P (Signed)
Tiffany Thompson [facility]  Location: [facility] Patient #: [id number] DOB: Oct 13, 1950 Widowed / Language: [name] / Race: White Female        History of Present Illness Patient words: discuss reexcision of margins.  The patient is a 65 year old female who presents with breast cancer. This is a 65 year old female who returns following right breast lumpectomy and sentinel node biopsy for breast cancer because of a positive anterior margin. Dr. Lindi Adie and Dr. [name] are involved in her care. Dr. Harlene Ramus is her PCP.  Earlier this week she underwent right breast lumpectomy, lower outer quadrant and right axillary sentinel node biopsy. She is doing well and has no complaints about her wounds or her arm or her shoulder. Final pathology shows a 1 cm invasive and in situ cancer. All margins are widely negative greater than 1 cm except the invasive carcinoma is focally present at the anterior margin. This is surprising since the specimen mammogram shows the marker clip in the radioactive seed in the exact center of the large specimen. I showed this to the patient. All 4 sentinel nodes are negative. Strongly receptor positive. HER-2 negative.  Hopefully she will be a candidate for adjuvant whole breast radiation therapy and antiestrogen therapy. She will see Dr. Lindi Adie on May 3.  I explained that we need to go back and reexcise the anterior margin including the overlying skin. She is accepting of this. She knows that this could be a false positive finding or she may have residual cancer. It will be hard to tell until we go back. She is very willing to do this. She is aware of the risk of bleeding, infection, cosmetic deformity, nerve damage with chronic pain. She understands all these issues well. At this time all of her questions are answered. She agrees with this plan.   Allergies  No Known Drug Allergies04/27/2017  Medication History  Atorvastatin Calcium ('20MG'$   Tablet, Oral) Active. No Current Medications (Taken starting 06/23/2015) Ciprofloxacin HCl (0.3% Solution, Ophthalmic) Active. ClonazePAM ('1MG'$  Tablet, Oral) Active. HydroCHLOROthiazide ('25MG'$  Tablet, Oral) Active. Latanoprost (0.005% Solution, Ophthalmic) Active. Levothyroxine Sodium (150MCG Tablet, Oral) Active. Timolol Maleate (0.5% Solution, Ophthalmic) Active. Telmisartan ('40MG'$  Tablet, Oral) Active. PrednisoLONE Acetate (1% Suspension, Ophthalmic) Active. Potassium Chloride Crys ER (10MEQ Tablet ER, Oral) Active. Hydrocodone-Acetaminophen (5-'325MG'$  Tablet, Oral) Active. Fish Oil + D3 (1200-'1000MG'$ -UNIT Capsule, Oral) Active. Medications Reconciled  Vitals  Weight: 193 lb Height: 65in Body Surface Area: 1.95 m Body Mass Index: 32.12 kg/m  Temp.: 97.59F  Pulse: 127 (Regular)  BP: 160/100 (Sitting, Left Arm, Standard)       Physical Exam  General Note: Very pleasant and cooperative. No distress. Daughter is with her.  Lungs: Clear to auscultation bilaterally  Heart: Regular rate and rhythm.  No murmur or ectopy   Breast Note: Incision right breast lower outer quadrant looks very good. Excellent contour. Tissue soft. No hematoma. Right axillary incision is also very soft and is healing normally. No arm swelling or numbness.     Assessment & Plan  BREAST CANCER OF LOWER-OUTER QUADRANT OF RIGHT FEMALE BREAST (C50.511)   As we discussed, your final pathology shows a small invasive ductal carcinoma, hormone receptor positive. The anterior margin was focally positive. The rest of the margins are widely negative. Your lymph nodes are negative. As we discussed, this may be a false positive margin since the specimen mammogram looks good. Nevertheless, the standard of care is to return to the operating room for right breast lumpectomy with reexcision  of the anterior margin. Keep your appointment with Dr. Lindi Adie  We will schedule this margin reexcision  in the near future  HYPERTENSION, BENIGN (I10) FORMER SMOKER (B30.104) GLAUCOMA (H40.9)   [name]. [name], M.D., [facility] General and Minimally invasive Surgery Breast and Colorectal Surgery Office:   [phone] Pager:   [phone]

## 2015-06-29 ENCOUNTER — Ambulatory Visit (HOSPITAL_COMMUNITY): Payer: Medicare Other | Admitting: Certified Registered Nurse Anesthetist

## 2015-06-29 ENCOUNTER — Ambulatory Visit (HOSPITAL_COMMUNITY)
Admission: RE | Admit: 2015-06-29 | Discharge: 2015-06-29 | Disposition: A | Discharge status: Home / Self Care | Payer: Medicare Other | Source: Ambulatory Visit | Attending: General Surgery | Admitting: General Surgery

## 2015-06-29 ENCOUNTER — Encounter (HOSPITAL_COMMUNITY)
Admission: RE | Disposition: A | Discharge status: Home / Self Care | Payer: Self-pay | Source: Ambulatory Visit | Attending: General Surgery

## 2015-06-29 ENCOUNTER — Encounter (HOSPITAL_COMMUNITY): Payer: Self-pay

## 2015-06-29 ENCOUNTER — Ambulatory Visit: Payer: No Typology Code available for payment source | Admitting: Hematology and Oncology

## 2015-06-29 DIAGNOSIS — C50511 Malignant neoplasm of lower-outer quadrant of right female breast: Secondary | ICD-10-CM | POA: Diagnosis present

## 2015-06-29 DIAGNOSIS — F419 Anxiety disorder, unspecified: Secondary | ICD-10-CM | POA: Insufficient documentation

## 2015-06-29 DIAGNOSIS — I10 Essential (primary) hypertension: Secondary | ICD-10-CM | POA: Insufficient documentation

## 2015-06-29 DIAGNOSIS — E039 Hypothyroidism, unspecified: Secondary | ICD-10-CM | POA: Diagnosis not present

## 2015-06-29 DIAGNOSIS — Z171 Estrogen receptor negative status [ER-]: Secondary | ICD-10-CM | POA: Insufficient documentation

## 2015-06-29 DIAGNOSIS — Z79899 Other long term (current) drug therapy: Secondary | ICD-10-CM | POA: Insufficient documentation

## 2015-06-29 DIAGNOSIS — Z87891 Personal history of nicotine dependence: Secondary | ICD-10-CM | POA: Diagnosis not present

## 2015-06-29 DIAGNOSIS — Z79891 Long term (current) use of opiate analgesic: Secondary | ICD-10-CM | POA: Insufficient documentation

## 2015-06-29 HISTORY — PX: RE-EXCISION OF BREAST CANCER,SUPERIOR MARGINS: SHX6047

## 2015-06-29 SURGERY — RE-EXCISION OF BREAST CANCER,SUPERIOR MARGINS
Anesthesia: General | Site: Breast | Laterality: Right

## 2015-06-29 MED ORDER — SODIUM CHLORIDE 0.9% FLUSH: 3.0000 mL | Freq: Two times a day (BID) | INTRAVENOUS | Status: DC

## 2015-06-29 MED ORDER — METOCLOPRAMIDE HCL 5 MG/ML IJ SOLN: 10.0000 mg | Freq: Once | INTRAMUSCULAR | Status: DC | PRN

## 2015-06-29 MED ORDER — SODIUM CHLORIDE 0.9% FLUSH: 3.0000 mL | INTRAVENOUS | Status: DC | PRN

## 2015-06-29 MED ORDER — LIDOCAINE HCL (CARDIAC) 20 MG/ML IV SOLN
INTRAVENOUS | Status: AC
  Filled 2015-06-29: qty 5

## 2015-06-29 MED ORDER — BUPIVACAINE-EPINEPHRINE 0.25% -1:200000 IJ SOLN
INTRAMUSCULAR | Status: DC | PRN
  Administered 2015-06-29: 10 mL

## 2015-06-29 MED ORDER — FENTANYL CITRATE (PF) 100 MCG/2ML IJ SOLN
INTRAMUSCULAR | Status: AC
  Filled 2015-06-29: qty 2

## 2015-06-29 MED ORDER — OXYCODONE HCL 5 MG PO TABS: 5.0000 mg | ORAL_TABLET | ORAL | Status: DC | PRN

## 2015-06-29 MED ORDER — MEPERIDINE HCL 50 MG/ML IJ SOLN: 6.2500 mg | INTRAMUSCULAR | Status: DC | PRN

## 2015-06-29 MED ORDER — ONDANSETRON HCL 4 MG/2ML IJ SOLN
INTRAMUSCULAR | Status: AC
  Filled 2015-06-29: qty 2

## 2015-06-29 MED ORDER — ACETAMINOPHEN 325 MG PO TABS: 650.0000 mg | ORAL_TABLET | ORAL | Status: DC | PRN

## 2015-06-29 MED ORDER — PROPOFOL 10 MG/ML IV BOLUS
INTRAVENOUS | Status: AC
  Filled 2015-06-29: qty 20

## 2015-06-29 MED ORDER — DEXAMETHASONE SODIUM PHOSPHATE 10 MG/ML IJ SOLN
INTRAMUSCULAR | Status: AC
  Filled 2015-06-29: qty 1

## 2015-06-29 MED ORDER — CHLORHEXIDINE GLUCONATE 4 % EX LIQD: 1.0000 "application " | Freq: Once | CUTANEOUS | Status: DC

## 2015-06-29 MED ORDER — FENTANYL CITRATE (PF) 100 MCG/2ML IJ SOLN: 25.0000 ug | INTRAMUSCULAR | Status: DC | PRN

## 2015-06-29 MED ORDER — ACETAMINOPHEN 650 MG RE SUPP
650.0000 mg | RECTAL | Status: DC | PRN
  Filled 2015-06-29: qty 1

## 2015-06-29 MED ORDER — EPHEDRINE SULFATE 50 MG/ML IJ SOLN
INTRAMUSCULAR | Status: AC
  Filled 2015-06-29: qty 1

## 2015-06-29 MED ORDER — LACTATED RINGERS IV SOLN
INTRAVENOUS | Status: DC
  Administered 2015-06-29: 10:00:00 via INTRAVENOUS

## 2015-06-29 MED ORDER — ONDANSETRON HCL 4 MG/2ML IJ SOLN
INTRAMUSCULAR | Status: DC | PRN
  Administered 2015-06-29: 4 mg via INTRAVENOUS

## 2015-06-29 MED ORDER — SODIUM CHLORIDE 0.9 % IJ SOLN
INTRAMUSCULAR | Status: AC
  Filled 2015-06-29: qty 10

## 2015-06-29 MED ORDER — PHENYLEPHRINE 40 MCG/ML (10ML) SYRINGE FOR IV PUSH (FOR BLOOD PRESSURE SUPPORT)
PREFILLED_SYRINGE | INTRAVENOUS | Status: AC
  Filled 2015-06-29: qty 20

## 2015-06-29 MED ORDER — SODIUM CHLORIDE 0.9 % IV SOLN: 250.0000 mL | INTRAVENOUS | Status: DC | PRN

## 2015-06-29 MED ORDER — MIDAZOLAM HCL 2 MG/2ML IJ SOLN
INTRAMUSCULAR | Status: AC
  Filled 2015-06-29: qty 2

## 2015-06-29 MED ORDER — SODIUM CHLORIDE 0.9 % IV SOLN: INTRAVENOUS | Status: DC

## 2015-06-29 MED ORDER — HYDROCODONE-ACETAMINOPHEN 5-325 MG PO TABS: 1.0000 | ORAL_TABLET | Freq: Four times a day (QID) | ORAL | Status: DC | PRN

## 2015-06-29 MED ORDER — MIDAZOLAM HCL 5 MG/5ML IJ SOLN
INTRAMUSCULAR | Status: DC | PRN
  Administered 2015-06-29: 2 mg via INTRAVENOUS

## 2015-06-29 MED ORDER — PHENYLEPHRINE HCL 10 MG/ML IJ SOLN
INTRAMUSCULAR | Status: DC | PRN
  Administered 2015-06-29: 120 ug via INTRAVENOUS
  Administered 2015-06-29: 40 ug via INTRAVENOUS
  Administered 2015-06-29 (×3): 120 ug via INTRAVENOUS
  Administered 2015-06-29: 40 ug via INTRAVENOUS
  Administered 2015-06-29 (×2): 80 ug via INTRAVENOUS
  Administered 2015-06-29: 40 ug via INTRAVENOUS

## 2015-06-29 MED ORDER — CEFAZOLIN SODIUM-DEXTROSE 2-4 GM/100ML-% IV SOLN
INTRAVENOUS | Status: AC
  Filled 2015-06-29: qty 100

## 2015-06-29 MED ORDER — BUPIVACAINE-EPINEPHRINE (PF) 0.25% -1:200000 IJ SOLN
INTRAMUSCULAR | Status: AC
  Filled 2015-06-29: qty 30

## 2015-06-29 MED ORDER — LACTATED RINGERS IV SOLN
INTRAVENOUS | Status: DC | PRN
  Administered 2015-06-29: 08:00:00 via INTRAVENOUS

## 2015-06-29 MED ORDER — CEFAZOLIN SODIUM-DEXTROSE 2-4 GM/100ML-% IV SOLN
2.0000 g | INTRAVENOUS | Status: AC
  Administered 2015-06-29: 2 g via INTRAVENOUS
  Filled 2015-06-29: qty 100

## 2015-06-29 MED ORDER — EPHEDRINE SULFATE 50 MG/ML IJ SOLN
INTRAMUSCULAR | Status: DC | PRN
  Administered 2015-06-29: 20 mg via INTRAVENOUS
  Administered 2015-06-29 (×8): 10 mg via INTRAVENOUS

## 2015-06-29 MED ORDER — FENTANYL CITRATE (PF) 100 MCG/2ML IJ SOLN
INTRAMUSCULAR | Status: DC | PRN
  Administered 2015-06-29 (×2): 25 ug via INTRAVENOUS

## 2015-06-29 MED ORDER — DEXAMETHASONE SODIUM PHOSPHATE 4 MG/ML IJ SOLN
INTRAMUSCULAR | Status: DC | PRN
  Administered 2015-06-29: 10 mg via INTRAVENOUS

## 2015-06-29 SURGICAL SUPPLY — 33 items
APPLIER CLIP 11 MED OPEN (CLIP)
BENZOIN TINCTURE PRP APPL 2/3 (GAUZE/BANDAGES/DRESSINGS) ×2 IMPLANT
BINDER BREAST LRG (GAUZE/BANDAGES/DRESSINGS) ×2 IMPLANT
BLADE HEX COATED 2.75 (ELECTRODE) ×2 IMPLANT
CLIP APPLIE 11 MED OPEN (CLIP) IMPLANT
CLIP TI WIDE RED SMALL 6 (CLIP) IMPLANT
COVER SURGICAL LIGHT HANDLE (MISCELLANEOUS) IMPLANT
DISSECTOR ROUND CHERRY 3/8 STR (MISCELLANEOUS) ×2 IMPLANT
DRAIN CHANNEL 15F RND FF 3/16 (WOUND CARE) IMPLANT
DRAPE LAPAROSCOPIC ABDOMINAL (DRAPES) ×2 IMPLANT
DRAPE POUCH INSTRU U-SHP 10X18 (DRAPES) ×2 IMPLANT
ELECT REM PT RETURN 9FT ADLT (ELECTROSURGICAL) ×2
ELECTRODE REM PT RTRN 9FT ADLT (ELECTROSURGICAL) ×1 IMPLANT
GAUZE SPONGE 4X4 12PLY STRL (GAUZE/BANDAGES/DRESSINGS) ×2 IMPLANT
GLOVE BIOGEL PI IND STRL 7.0 (GLOVE) ×1 IMPLANT
GLOVE BIOGEL PI INDICATOR 7.0 (GLOVE) ×1
GLOVE EUDERMIC 7 POWDERFREE (GLOVE) ×4 IMPLANT
GOWN STRL REUS W/TWL LRG LVL3 (GOWN DISPOSABLE) ×2 IMPLANT
GOWN STRL REUS W/TWL XL LVL3 (GOWN DISPOSABLE) ×4 IMPLANT
KIT MARKER MARGIN INK (KITS) ×2 IMPLANT
LIQUID BAND (GAUZE/BANDAGES/DRESSINGS) ×2 IMPLANT
MARKER SKIN DUAL TIP RULER LAB (MISCELLANEOUS) ×2 IMPLANT
NEEDLE HYPO 22GX1.5 SAFETY (NEEDLE) ×2 IMPLANT
PACK GENERAL/GYN (CUSTOM PROCEDURE TRAY) ×2 IMPLANT
PAD ABD 8X10 STRL (GAUZE/BANDAGES/DRESSINGS) ×2 IMPLANT
STAPLER VISISTAT 35W (STAPLE) ×2 IMPLANT
STRIP CLOSURE SKIN 1/2X4 (GAUZE/BANDAGES/DRESSINGS) ×2 IMPLANT
SUT MNCRL AB 4-0 PS2 18 (SUTURE) ×2 IMPLANT
SUT VIC AB 3-0 SH 18 (SUTURE) ×2 IMPLANT
SUT VICRYL 2 0 18  UND BR (SUTURE) ×1
SUT VICRYL 2 0 18 UND BR (SUTURE) ×1 IMPLANT
SYR CONTROL 10ML LL (SYRINGE) ×4 IMPLANT
TOWEL OR 17X26 10 PK STRL BLUE (TOWEL DISPOSABLE) ×2 IMPLANT

## 2015-06-29 NOTE — Op Note (Signed)
Patient Name:           Tiffany Thompson   Date of Surgery:        06/29/2015  Pre op Diagnosis:      Invasive ductal carcinoma right breast, lower outer quadrant, stage TIc, N0                                      Focally positive anterior margin following definitive lumpectomy  Post op Diagnosis:    Same  Procedure:                 Right breast lumpectomy with reexcision of margins  Surgeon:                     [name]. [name], M.D., FACS  Assistant:                      OR staff  Operative Indications:    This is a 65 year old female who returns following right breast lumpectomy and sentinel node biopsy for breast cancer because of a positive anterior margin. Dr. Lindi Adie and Dr. [name] are involved in her care. Dr. Harlene Ramus is her PCP. Earlier last week she underwent right breast lumpectomy, lower outer quadrant and right axillary sentinel node biopsy. She is doing well and has no complaints about her wounds or her arm or her shoulder. Final pathology shows a 1 cm invasive and in situ cancer. All margins are widely negative greater than 1 cm except the invasive carcinoma is focally present at the anterior margin. This is surprising since the specimen mammogram shows the marker clip and the radioactive seed in the exact center of the large specimen. I showed this to the patient. All 4 sentinel nodes are negative. Strongly receptor positive. HER-2 negative.     Hopefully she will be a candidate for adjuvant whole breast radiation therapy and antiestrogen therapy. She will see Dr. Lindi Adie on May 10.     I explained that we need to go back and reexcise the anterior margin including the overlying skin. She is accepting of this. She knows that this could be a false positive finding or she may have residual cancer. It will be hard to tell until we go back. She is very willing to do this. She is aware of the risk of bleeding, infection, cosmetic deformity, nerve damage with  chronic pain. She understands all these issues well. At this time all of her questions are answered. She agrees with this plan.  Operative Findings:       I resected an ellipse of skin as well as all of the anterior tissue and some of the tissue medial and lateral and superior and inferior to the lumpectomy cavity.  There was no grossly palpable abnormal finding.  Procedure in Detail:          Following the induction of general LMA anesthesia the patient's right breast was prepped and draped in a sterile fashion.  Surgical timeout was performed.  Intravenous antibiotics were given.  0.5% Marcaine with epinephrine was used as a local infiltration anesthetic.     Using a marking pen, I drew the intended incision which was a radially oriented ellipse around the old scar.  The incision was made and I dissected circumferentially all the tissue anterior to the lumpectomy cavity and went around the  circumference a little  bit as well.  Using silk sutures I marked the superior, lateral, and lumpectomy cavity spaces.  I then used the 6 color kit to mark the margins as well the specimen was sent fresh to the lab for routine histology.     Hemostasis was excellent and achieved with electrocautery.  The wound was irrigated.  I closed the breast tissue in multiple layers with interrupted Vicryl sutures and the skin with a running subcuticular 4-0 Monocryl and Dermabond.  Breast binder was placed and the patient taken to PACU in stable condition.  EBL 20 mL.  Counts correct.  Complications none.     [name]. [name], M.D., FACS General and Minimally Invasive Surgery Breast and Colorectal Surgery  06/29/2015 9:35 AM

## 2015-06-29 NOTE — Anesthesia Procedure Notes (Signed)
Procedure Name: LMA Insertion Date/Time: 06/29/2015 8:31 AM Performed by: Deliah Boston Pre-anesthesia Checklist: Patient identified, Emergency Drugs available, Suction available and Patient being monitored Patient Re-evaluated:Patient Re-evaluated prior to inductionOxygen Delivery Method: Circle system utilized Preoxygenation: Pre-oxygenation with 100% oxygen Intubation Type: IV induction Ventilation: Mask ventilation without difficulty LMA: LMA inserted LMA Size: 4.0 Number of attempts: 1 Placement Confirmation: positive ETCO2 and breath sounds checked- equal and bilateral Tube secured with: Tape Dental Injury: Teeth and Oropharynx as per pre-operative assessment  Comments: Pt remains with very loose left incisor. Informed of possibility of tooth coming out with airway placement, Pt stated she was ok with that, she had been trying to get the tooth out herself. Tooth remains as preop after LMA placement. LMA taped to left side.

## 2015-06-29 NOTE — Discharge Instructions (Signed)
[facility] Office Phone Number [phone]  BREAST BIOPSY/ PARTIAL MASTECTOMY: POST OP INSTRUCTIONS  Always review your discharge instruction sheet given to you by the facility where your surgery was performed.  IF YOU HAVE DISABILITY OR FAMILY LEAVE FORMS, YOU MUST BRING THEM TO THE OFFICE FOR PROCESSING.  DO NOT GIVE THEM TO YOUR DOCTOR.  1. A prescription for pain medication may be given to you upon discharge.  Take your pain medication as prescribed, if needed.  If narcotic pain medicine is not needed, then you may take acetaminophen (Tylenol) or ibuprofen (Advil) as needed. 2. Take your usually prescribed medications unless otherwise directed 3. If you need a refill on your pain medication, please contact your pharmacy.  They will contact our office to request authorization.  Prescriptions will not be filled after 5pm or on week-ends. 4. You should eat very light the first 24 hours after surgery, such as soup, crackers, pudding, etc.  Resume your normal diet the day after surgery. 5. Most patients will experience some swelling and bruising in the breast.  Ice packs and a good support bra will help.  Swelling and bruising can take several days to resolve.  6. It is common to experience some constipation if taking pain medication after surgery.  Increasing fluid intake and taking a stool softener will usually help or prevent this problem from occurring.  A mild laxative (Milk of Magnesia or Miralax) should be taken according to package directions if there are no bowel movements after 48 hours. 7. Unless discharge instructions indicate otherwise, you may remove your bandages 24-48 hours after surgery, and you may shower at that time.  You may have steri-strips (small skin tapes) in place directly over the incision.  These strips should be left on the skin for 7-10 days.  If your surgeon used skin glue on the incision, you may shower in 24 hours.  The glue will flake off over the  next 2-3 weeks.  Any sutures or staples will be removed at the office during your follow-up visit. 8. ACTIVITIES:  You may resume regular daily activities (gradually increasing) beginning the next day.  Wearing a good support bra or sports bra minimizes pain and swelling.  You may have sexual intercourse when it is comfortable. a. You may drive when you no longer are taking prescription pain medication, you can comfortably wear a seatbelt, and you can safely maneuver your car and apply brakes. b. RETURN TO WORK:  ______________________________________________________________________________________ 9. You should see your doctor in the office for a follow-up appointment approximately two weeks after your surgery.  Your doctors nurse will typically make your follow-up appointment when she calls you with your pathology report.  Expect your pathology report 2-3 business days after your surgery.  You may call to check if you do not hear from [place] after three days. 10. OTHER INSTRUCTIONS: _______________________________________________________________________________________________ _____________________________________________________________________________________________________________________________________ _____________________________________________________________________________________________________________________________________ _____________________________________________________________________________________________________________________________________  WHEN TO CALL YOUR DOCTOR: 1. Fever over 101.0 2. Nausea and/or vomiting. 3. Extreme swelling or bruising. 4. Continued bleeding from incision. 5. Increased pain, redness, or drainage from the incision.  The clinic staff is available to answer your questions during regular business hours.  Please dont hesitate to call and ask to speak to one of the nurses for clinical concerns.  If you have a medical emergency, go to the nearest  emergency room or call 911.  A surgeon from [facility] is always on call at the hospital.  For further questions, please visit [email or web address]  General Anesthesia, Adult, Care After Refer to this sheet in the next few weeks. These instructions provide you with information on caring for yourself after your procedure. Your health care provider may also give you more specific instructions. Your treatment has been planned according to current medical practices, but problems sometimes occur. Call your health care provider if you have any problems or questions after your procedure. WHAT TO EXPECT AFTER THE PROCEDURE After the procedure, it is typical to experience:  Sleepiness.  Nausea and vomiting. HOME CARE INSTRUCTIONS  For the first 24 hours after general anesthesia:  Have a responsible person with you.  Do not drive a car. If you are alone, do not take public transportation.  Do not drink alcohol.  Do not take medicine that has not been prescribed by your health care provider.  Do not sign important papers or make important decisions.  You may resume a normal diet and activities as directed by your health care provider.  Change bandages (dressings) as directed.  If you have questions or problems that seem related to general anesthesia, call the hospital and ask for the anesthetist or anesthesiologist on call. SEEK MEDICAL CARE IF:  You have nausea and vomiting that continue the day after anesthesia.  You develop a rash. SEEK IMMEDIATE MEDICAL CARE IF:   You have difficulty breathing.  You have chest pain.  You have any allergic problems.   This information is not intended to replace advice given to you by your health care provider. Make sure you discuss any questions you have with your health care provider.   Document Released: 05/21/2000 Document Revised: 03/05/2014 Document Reviewed: 06/13/2011 Elsevier Interactive Patient  Education [employer].

## 2015-06-29 NOTE — Anesthesia Postprocedure Evaluation (Signed)
Anesthesia Post Note  Patient: Tiffany Thompson  Procedure(s) Performed: Procedure(s) (LRB): RE-EXCISION OF BREAST LUMPECTOMY MARGINS (Right)  Patient location during evaluation: PACU Anesthesia Type: General Level of consciousness: awake and alert Pain management: pain level controlled Vital Signs Assessment: post-procedure vital signs reviewed and stable Respiratory status: spontaneous breathing, nonlabored ventilation, respiratory function stable and patient connected to nasal cannula oxygen Cardiovascular status: blood pressure returned to baseline and stable Postop Assessment: no signs of nausea or vomiting Anesthetic complications: no    Last Vitals:  Filed Vitals:   06/29/15 1044 06/29/15 1149  BP: 83/56 118/86  Pulse: 92 103  Temp: 36.6 C 36.6 C  Resp: 14 16    Last Pain:  Filed Vitals:   06/29/15 1150  PainSc: 0-No pain                 Montez Hageman

## 2015-06-29 NOTE — Transfer of Care (Signed)
Immediate Anesthesia Transfer of Care Note  Patient: Tiffany Thompson  Procedure(s) Performed: Procedure(s): RE-EXCISION OF BREAST LUMPECTOMY MARGINS (Right)  Patient Location: PACU  Anesthesia Type:General  Level of Consciousness: Patient easily awoken, sedated, comfortable, cooperative, following commands, responds to stimulation.   Airway & Oxygen Therapy: Patient spontaneously breathing, ventilating well, oxygen via simple oxygen mask.  Post-op Assessment: Report given to PACU RN, vital signs reviewed and stable, moving all extremities.   Post vital signs: Reviewed and stable.  Complications: No apparent anesthesia complications

## 2015-06-29 NOTE — Anesthesia Preprocedure Evaluation (Addendum)
Anesthesia Evaluation  Patient identified by MRN, date of birth, ID band Patient awake    Reviewed: Allergy & Precautions, NPO status , Patient's Chart, lab work & pertinent test results  Airway Mallampati: II  TM Distance: >3 FB Neck ROM: Full    Dental  (+) Dental Advisory Given, Loose, Missing,    Pulmonary former smoker,    breath sounds clear to auscultation       Cardiovascular hypertension, Pt. on medications  Rhythm:Regular Rate:Normal     Neuro/Psych Anxiety negative neurological ROS     GI/Hepatic negative GI ROS, Neg liver ROS,   Endo/Other  Hypothyroidism   Renal/GU negative Renal ROS     Musculoskeletal negative musculoskeletal ROS (+)   Abdominal   Peds  Hematology negative hematology ROS (+)   Anesthesia Other Findings   Reproductive/Obstetrics                           Lab Results  Component Value Date   WBC 7.6 06/01/2015   HGB 14.1 06/01/2015   HCT 40.7 06/01/2015   MCV 87.7 06/01/2015   PLT 280 06/01/2015   Lab Results  Component Value Date   CREATININE 1.00 06/13/2015   BUN 17 06/13/2015   NA 136 06/13/2015   K 3.8 06/13/2015   CL 96* 06/13/2015   CO2 28 06/13/2015    Anesthesia Physical  Anesthesia Plan  ASA: II  Anesthesia Plan: General   Post-op Pain Management:    Induction: Intravenous  Airway Management Planned: LMA  Additional Equipment:   Intra-op Plan:   Post-operative Plan: Extubation in OR  Informed Consent: I have reviewed the patients History and Physical, chart, labs and discussed the procedure including the risks, benefits and alternatives for the proposed anesthesia with the patient or authorized representative who has indicated his/her understanding and acceptance.   Dental advisory given  Plan Discussed with: CRNA  Anesthesia Plan Comments:         Anesthesia Quick Evaluation

## 2015-06-29 NOTE — Progress Notes (Signed)
Dr. Marcell Barlow made aware patient's blood pressures- patient may go to Short Stay if mean is >60

## 2015-06-30 ENCOUNTER — Encounter (HOSPITAL_COMMUNITY): Payer: Self-pay

## 2015-06-30 NOTE — Progress Notes (Signed)
Quick Note:  Inform patient of Pathology report,.No residual tumor seen. She will not require any more surgery. Great news!  Ammie please call Friday  hmi ______

## 2015-07-05 ENCOUNTER — Encounter (HOSPITAL_COMMUNITY): Payer: Self-pay

## 2015-07-06 ENCOUNTER — Encounter: Payer: Self-pay | Admitting: Hematology and Oncology

## 2015-07-06 ENCOUNTER — Telehealth: Payer: Self-pay | Admitting: Hematology and Oncology

## 2015-07-06 ENCOUNTER — Ambulatory Visit (HOSPITAL_BASED_OUTPATIENT_CLINIC_OR_DEPARTMENT_OTHER): Payer: Medicare Other | Admitting: Hematology and Oncology

## 2015-07-06 VITALS — BP 109/63 | HR 92 | Temp 98.4°F | Resp 18 | Ht 65.0 in | Wt 189.4 lb

## 2015-07-06 DIAGNOSIS — C50511 Malignant neoplasm of lower-outer quadrant of right female breast: Secondary | ICD-10-CM | POA: Diagnosis not present

## 2015-07-06 MED ORDER — ANASTROZOLE 1 MG PO TABS: 1.0000 mg | ORAL_TABLET | Freq: Every day | ORAL | Status: DC

## 2015-07-06 NOTE — Progress Notes (Signed)
Patient Care Team: Aretta Nip, MD as PCP - General (Family Medicine)  DIAGNOSIS: Breast cancer of lower-outer quadrant of right female breast [facility])   Staging form: Breast, AJCC 7th Edition     Clinical stage from 06/01/2015: Stage IA (T1b, N0, M0) - Unsigned   SUMMARY OF ONCOLOGIC HISTORY:   Breast cancer of lower-outer quadrant of right female breast ([place])   05/23/2015 Initial Diagnosis Right breast biopsy 8:00 position: Invasive ductal carcinoma with DCIS, grade 1, ER 100%, PR 100%, Ki-67 10%, HER-2 negative ratio 1.36, mammogram revealed a 10 mm mass 8 cm from the nipple, T1b N0 stage IA clinical stage   06/20/2015 Surgery Right lumpectomy: IDC with DCIS, 1 cm, invasive cancer focally at anterior margin, 0/4 lymph nodes negative, T1 BN 0 stage IA; 3 resection of the anterior margin is negative for cancer, Oncotype DX score 7, 5% ROR    CHIEF COMPLIANT: F/U after lumpectomy  INTERVAL HISTORY: Tiffany Thompson is a 65 yr old with h/o rt breast cancer who had undergone lumpectomy and re-excision is here to discuss the result of oncotype DX. She is recovering from recent surgery.  REVIEW OF SYSTEMS:   Constitutional: Denies fevers, chills or abnormal weight loss Eyes: Denies blurriness of vision Ears, nose, mouth, throat, and face: Denies mucositis or sore throat Respiratory: Denies cough, dyspnea or wheezes Cardiovascular: Denies palpitation, chest discomfort Gastrointestinal:  Denies nausea, heartburn or change in bowel habits Skin: Denies abnormal skin rashes Lymphatics: Denies new lymphadenopathy or easy bruising Neurological:Denies numbness, tingling or new weaknesses Behavioral/Psych: Mood is stable, no new changes  Extremities: No lower extremity edema Breast: recent surgery All other systems were reviewed with the patient and are negative.  I have reviewed the past medical history, past surgical history, social history and family history with the patient and they are  unchanged from previous note.  ALLERGIES:  is allergic to brimonidine; dorzolamide; and dorzolamide hcl-timolol mal pf.  MEDICATIONS:  Current Outpatient Prescriptions  Medication Sig Dispense Refill  . anastrozole (ARIMIDEX) 1 MG tablet Take 1 tablet (1 mg total) by mouth daily. 90 tablet 3  . atorvastatin (LIPITOR) 20 MG tablet Take 20 mg by mouth daily.    . Calcium Carb-Cholecalciferol (CALCIUM + D3) 600-200 MG-UNIT TABS Take 1 tablet by mouth 2 (two) times daily.    . chlorhexidine (HIBICLENS) 4 % external liquid Apply 15 mLs (1 application total) topically once. 120 mL 0  . chlorhexidine (HIBICLENS) 4 % external liquid Apply 15 mLs (1 application total) topically once. 120 mL 0  . clonazePAM (KLONOPIN) 1 MG tablet Take 0.5-1 mg by mouth 2 (two) times daily as needed for anxiety.     . hydrochlorothiazide (HYDRODIURIL) 25 MG tablet Take 25 mg by mouth daily.    Marland Kitchen HYDROcodone-acetaminophen (NORCO) 5-325 MG tablet Take 1-2 tablets by mouth every 6 (six) hours as needed for moderate pain or severe pain. 30 tablet 0  . HYDROcodone-acetaminophen (NORCO) 5-325 MG tablet Take 1-2 tablets by mouth every 6 (six) hours as needed. 30 tablet 0  . latanoprost (XALATAN) 0.005 % ophthalmic solution Place 1 drop into the left eye at bedtime.     Marland Kitchen levothyroxine (SYNTHROID, LEVOTHROID) 150 MCG tablet Take 150 mcg by mouth daily before breakfast.  0  . Omega-3 Fatty Acids (FISH OIL) 1000 MG CAPS Take 2,000 mg by mouth 2 (two) times daily.     Marland Kitchen OVER THE COUNTER MEDICATION Take 2,000 mg by mouth daily. Cinnamon 1035m    .  potassium chloride (K-DUR,KLOR-CON) 10 MEQ tablet Take 20 mEq by mouth daily.   0  . telmisartan (MICARDIS) 40 MG tablet Take 40 mg by mouth daily.    . timolol (TIMOPTIC) 0.5 % ophthalmic solution Place 1 drop into both eyes 2 (two) times daily.      No current facility-administered medications for this visit.    PHYSICAL EXAMINATION: ECOG PERFORMANCE STATUS: 1  Filed Vitals:    07/06/15 1515  BP: 109/63  Pulse: 92  Temp: 98.4 F (36.9 C)  Resp: 18   Filed Weights   07/06/15 1515  Weight: 189 lb 6.4 oz (85.911 kg)    GENERAL:alert, no distress and comfortable SKIN: skin color, texture, turgor are normal, no rashes or significant lesions EYES: normal, Conjunctiva are pink and non-injected, sclera clear OROPHARYNX:no exudate, no erythema and lips, buccal mucosa, and tongue normal  NECK: supple, thyroid normal size, non-tender, without nodularity LYMPH:  no palpable lymphadenopathy in the cervical, axillary or inguinal LUNGS: clear to auscultation and percussion with normal breathing effort HEART: regular rate & rhythm and no murmurs and no lower extremity edema ABDOMEN:abdomen soft, non-tender and normal bowel sounds MUSCULOSKELETAL:no cyanosis of digits and no clubbing  NEURO: alert & oriented x 3 with fluent speech, no focal motor/sensory deficits EXTREMITIES: No lower extremity edema  LABORATORY DATA:  I have reviewed the data as listed   Chemistry      Component Value Date/Time   NA 136 06/13/2015 1230   NA 139 06/01/2015 0848   K 3.8 06/13/2015 1230   K 3.3* 06/01/2015 0848   CL 96* 06/13/2015 1230   CO2 28 06/13/2015 1230   CO2 30* 06/01/2015 0848   BUN 17 06/13/2015 1230   BUN 28.2* 06/01/2015 0848   CREATININE 1.00 06/13/2015 1230   CREATININE 1.2* 06/01/2015 0848      Component Value Date/Time   CALCIUM 10.0 06/13/2015 1230   CALCIUM 10.1 06/01/2015 0848   ALKPHOS 141 06/01/2015 0848   ALKPHOS 121* 07/17/2010 2016   AST 15 06/01/2015 0848   AST 25 07/17/2010 2016   ALT 21 06/01/2015 0848   ALT 23 07/17/2010 2016   BILITOT 0.48 06/01/2015 0848   BILITOT 0.4 07/17/2010 2016       Lab Results  Component Value Date   WBC 7.6 06/01/2015   HGB 14.1 06/01/2015   HCT 40.7 06/01/2015   MCV 87.7 06/01/2015   PLT 280 06/01/2015   NEUTROABS 5.5 06/01/2015     ASSESSMENT & PLAN:  Breast cancer of lower-outer quadrant of right  female breast ([facility]) Right breast biopsy 8:00 position 05/23/2015: Invasive ductal carcinoma with DCIS, grade 1, ER 100%, PR 100%, Ki-67 10%, HER-2 negative ratio 1.36, mammogram revealed a 10 mm mass 8 cm from the nipple, T1b N0 stage IA clinical stage Right lumpectomy: IDC with DCIS, 1 cm, invasive cancer focally at anterior margin, 0/4 lymph nodes negative, T1 BN 0 stage IA; 3 resection of the anterior margin is negative for cancer, Oncotype DX score 7, 5% ROR  Recommendations: 1. Adjuvant radiation therapy followed by 2. Adjuvant antiestrogen therapy  Anastrozole counseling: I gave her a prescription for anastrozole that will start after XRT is complete. We discussed the risks and benefits of anti-estrogen therapy with aromatase inhibitors. These include but not limited to insomnia, hot flashes, mood changes, vaginal dryness, bone density loss, and weight gain. We strongly believe that the benefits far outweigh the risks. Patient understands these risks and consented to starting treatment. Planned treatment   duration is 5 years.   RTC 3 months after starting anastrozole   Orders Placed This Encounter  Procedures  . Ambulatory referral to Radiation Oncology    Referral Priority:  Routine    Referral Type:  Consultation    Referral Reason:  Specialty Services Required    Referred to Provider:  John Moody, MD    Requested Specialty:  Radiation Oncology    Number of Visits Requested:  1   The patient has a good understanding of the overall plan. she agrees with it. she will call with any problems that may develop before the next visit here.   Gudena, Vinay K, MD 07/06/2015     

## 2015-07-06 NOTE — Assessment & Plan Note (Signed)
Right breast biopsy 8:00 position 05/23/2015: Invasive ductal carcinoma with DCIS, grade 1, ER 100%, PR 100%, Ki-67 10%, HER-2 negative ratio 1.36, mammogram revealed a 10 mm mass 8 cm from the nipple, T1b N0 stage IA clinical stage Right lumpectomy: IDC with DCIS, 1 cm, invasive cancer focally at anterior margin, 0/4 lymph nodes negative, T1 BN 0 stage IA; 3 resection of the anterior margin is negative for cancer, Oncotype DX score 7, 5% ROR  Recommendations: 1. Adjuvant radiation therapy followed by 2. Adjuvant antiestrogen therapy  Anastrozole counseling: I gave her a prescription for anastrozole that will start after XRT is complete. We discussed the risks and benefits of anti-estrogen therapy with aromatase inhibitors. These include but not limited to insomnia, hot flashes, mood changes, vaginal dryness, bone density loss, and weight gain. We strongly believe that the benefits far outweigh the risks. Patient understands these risks and consented to starting treatment. Planned treatment duration is 5 years.   RTC 3 months after starting anastrozole

## 2015-07-06 NOTE — Telephone Encounter (Signed)
appt made and avs printed °

## 2015-07-07 NOTE — Progress Notes (Signed)
Location of Breast Cancer:Right Breast Lower Outer Quadrant  Histology per Pathology Report: 05/23/15: Breast,right needle core bx: 8:00 position=Invasive ductal carcinoma,ductal carcinoma in situ  Receptor Status: ER(100%+), PR (100%+), Her2-neu= neg, Ki-67(10%)  Did patient present with symptoms (if so, please note symptoms) or was this found on screening mammography?: routine screening mammogram  Past/Anticipated interventions by surgeon, if any:  Diagnosis 06/29/15:Dr. Fanny Skates, MD Breast, excision, right breast - BREAST DEMONSTRATING BENIGN SKIN WITH SCAR FORMATION. - UNDERLYING BENIGN FIBROFATTY SOFT TISSUE WITH FAT NECROSIS. - NO ATYPIA OR TUMOR SEEN. No residual tumor seen, she will not require any more surgery  Diagnosis4/24/17: Dr. Lattie Corns: 1. Breast, lumpectomy, Right - INVASIVE AND IN SITU DUCTAL CARCINOMA, 1 CM.- INVASIVE CARCINOMA FOCALLY AT ANTERIOR MARGIN- REMAINING MARGINS GREATER THAN 1 CM. - BIOPSY SITE CHANGES. 2. Lymph node, sentinel, biopsy, Right Axillary #1 - ONE BENIGN LYMPH NODE (0/1). 3. Lymph node, sentinel, biopsy, Right Axillary #2 1 of 4 FINAL for Patch, Shadiamond O ([id number]) Diagnosis(continued) - ONE BENIGN LYMPH NODE (0/1). 4. Lymph node, sentinel, biopsy, Possible Right Axillary #3 - ONE BENIGN LYMPH NODE (0/1) 5. Lymph node, sentinel, biopsy, Possible Right Axillary - ONE BENIGN LYMPH NODE (0/1)    Past/Anticipated interventions by medical oncology, if any: Chemotherapy : Dr. Lindi Adie note 07/06/15: Oncotype DX  Results= 7, Anasotrozole to start after radiation completed, has Rx  Lymphedema issues, if any: No  Pain issues, if any:  NO  SAFETY ISSUES:  Prior radiation? NO  Pacemaker/ICD? NO  Possible current pregnancy? NO  Is the patient on methotrexate?  NO  Current Complaints / other details:  Widowed,Anxiety, Menarche age 27, G40P1,  Oral contraceptives, smoked  Cigarettes 1ppd 1970=2007,occasional alcohol use, Left Eye  blindness, gets Alea injectins once monthly at [facility] in her right eye ,Father Heart disease,DM,HTN, deceased,Mother  Colon polyps,DM CHF, deceased, ,     Rebecca Eaton, RN 07/07/2015,2:23 PM  Gaspar Garbe, RN II Rad/Onc BP 101/66 mmHg  Pulse 97  Temp(Src) 98.2 F (36.8 C) (Oral)  Resp 20  Ht 5' 5.5" (1.664 m)  Wt 191 lb 4.8 oz (86.773 kg)  BMI 31.34 kg/m2  SpO2 100%  Wt Readings from Last 3 Encounters:  07/11/15 191 lb 4.8 oz (86.773 kg)  07/06/15 189 lb 6.4 oz (85.911 kg)  06/29/15 188 lb 4 oz (85.39 kg)

## 2015-07-11 ENCOUNTER — Ambulatory Visit
Admission: RE | Admit: 2015-07-11 | Discharge: 2015-07-11 | Disposition: A | Discharge status: Home / Self Care | Payer: Medicare Other | Source: Ambulatory Visit | Attending: Radiation Oncology | Admitting: Radiation Oncology

## 2015-07-11 ENCOUNTER — Encounter: Payer: Self-pay | Admitting: Radiation Oncology

## 2015-07-11 VITALS — BP 101/66 | HR 97 | Temp 98.2°F | Resp 20 | Ht 65.5 in | Wt 191.3 lb

## 2015-07-11 DIAGNOSIS — E079 Disorder of thyroid, unspecified: Secondary | ICD-10-CM | POA: Insufficient documentation

## 2015-07-11 DIAGNOSIS — F419 Anxiety disorder, unspecified: Secondary | ICD-10-CM | POA: Insufficient documentation

## 2015-07-11 DIAGNOSIS — Z170 Estrogen receptor positive status [ER+]: Secondary | ICD-10-CM | POA: Insufficient documentation

## 2015-07-11 DIAGNOSIS — Z510 Encounter for antineoplastic radiation therapy: Secondary | ICD-10-CM | POA: Diagnosis not present

## 2015-07-11 DIAGNOSIS — C50511 Malignant neoplasm of lower-outer quadrant of right female breast: Secondary | ICD-10-CM

## 2015-07-11 DIAGNOSIS — H409 Unspecified glaucoma: Secondary | ICD-10-CM | POA: Diagnosis not present

## 2015-07-11 DIAGNOSIS — E785 Hyperlipidemia, unspecified: Secondary | ICD-10-CM | POA: Insufficient documentation

## 2015-07-11 DIAGNOSIS — I10 Essential (primary) hypertension: Secondary | ICD-10-CM | POA: Insufficient documentation

## 2015-07-11 DIAGNOSIS — Z87891 Personal history of nicotine dependence: Secondary | ICD-10-CM | POA: Diagnosis not present

## 2015-07-11 DIAGNOSIS — E7800 Pure hypercholesterolemia, unspecified: Secondary | ICD-10-CM | POA: Insufficient documentation

## 2015-07-11 NOTE — Progress Notes (Signed)
Radiation Oncology         [phone] ________________________________  Name: Tiffany Thompson MRN: [id number]  Date: 07/11/2015  DOB: 1950-07-29  Follow-Up Visit Note  CC: Aretta Nip, MD  Fanny Skates, MD  Diagnosis:   Breast cancer of the lower-outer quadrant of the right female breast.  Narrative:  The patient returns today for routine follow-up. She was last seen for initial consultation in breast clinic 06/01/2015. She had a right breast lumpectomy 06/20/2015, revealing invasive and in situ ductal carcinoma and 0/4 positive lymph nodes. She had a right breast re-excision 06/29/2015. Her Oncotype testing showed her results were 7 and willl not need chemotherapy. She will begin anastrozole after radiation is completed. She is here today to discuss radiation treatment.  On review of systems, the patient reports that she is doing well overall. She denies any chest pain, and feels as though she's healing well. She denies shortness of breath, cough, fevers, chills, night sweats, unintended weight changes. She denies any bowel or bladder disturbances, and denies abdominal pain, nausea or vomiting. She denies any new musculoskeletal or joint aches or pains. A complete review of systems is obtained and is otherwise negative.  Past Medical History:  Past Medical History  Diagnosis Date  . Hypertension   . Hyperlipidemia     HYPERCHOLESTEROLEMIA  . Thyroid disease   . Glaucoma     WITH CENTRAL RETINAL VEIN OCCLUSION  . Anxiety   . Hypothyroidism     Past Surgical History: Past Surgical History  Procedure Laterality Date  . Tube for glaucoma      TO REDUCE PRESSURE  . Colonoscopy    . Cataract extraction    . Refractive surgery    . Radioactive seed guided mastectomy with axillary sentinel lymph node biopsy Right 06/20/2015    Procedure: RIGHT BREAST RADIOACTIVE SEED GUIDED PARTIAL MASTECTOMY WITH BLUE DYE INJECTION OF RIGHT BREAST AND RIGHT AXILLARY SENTINEL LYMPH NODE  BIOPSY;  Surgeon: Fanny Skates, MD;  Location: [facility];  Service: General;  Laterality: Right;  . Re-excision of breast cancer,superior margins Right 06/29/2015    Procedure: RE-EXCISION OF BREAST LUMPECTOMY MARGINS;  Surgeon: Fanny Skates, MD;  Location: [facility];  Service: General;  Laterality: Right;  . Eye injection Right     Social History:  Social History   Social History  . Marital Status: Widowed    Spouse Name: N/A  . Number of Children: N/A  . Years of Education: N/A   Occupational History  . Not on file.   Social History Main Topics  . Smoking status: Former Smoker -- 1.50 packs/day    Types: Cigarettes    Quit date: 05/31/2004  . Smokeless tobacco: Not on file  . Alcohol Use: No  . Drug Use: No  . Sexual Activity: Not on file   Other Topics Concern  . Not on file   Social History Narrative    Family History: Family History  Problem Relation Age of Onset  . Diabetes Mellitus I Mother   . Hyperlipidemia Mother   . Heart failure Mother   . Hypertension Father   . Heart failure Father   . Diabetes Mellitus I Father   . Hypertension Sister   . Diabetes Mellitus I Sister   . Hypertension Sister   . Dementia Sister   . Brain cancer Brother       ALLERGIES:  is allergic to brimonidine; dorzolamide; and dorzolamide hcl-timolol mal pf.  Meds: Current Outpatient Prescriptions  Medication Sig Dispense Refill  . acetaminophen (TYLENOL) 500 MG tablet Take 500 mg by mouth as needed for mild pain.    Marland Kitchen atorvastatin (LIPITOR) 20 MG tablet Take 20 mg by mouth daily.    . Calcium Carb-Cholecalciferol (CALCIUM + D3) 600-200 MG-UNIT TABS Take 1 tablet by mouth 2 (two) times daily.    . clonazePAM (KLONOPIN) 1 MG tablet Take 0.5-1 mg by mouth 2 (two) times daily as needed for anxiety.     . hydrochlorothiazide (HYDRODIURIL) 25 MG tablet Take 25 mg by mouth daily.    Marland Kitchen ibuprofen (ADVIL,MOTRIN) 200 MG tablet Take 200 mg by mouth every 6 (six) hours  as needed.    . latanoprost (XALATAN) 0.005 % ophthalmic solution Place 1 drop into the left eye at bedtime.     Marland Kitchen levothyroxine (SYNTHROID, LEVOTHROID) 150 MCG tablet Take 150 mcg by mouth daily before breakfast.  0  . Omega-3 Fatty Acids (FISH OIL) 1000 MG CAPS Take 2,000 mg by mouth 2 (two) times daily.     Marland Kitchen OVER THE COUNTER MEDICATION Take 2,000 mg by mouth daily. Cinnamon 1054m    . potassium chloride (K-DUR,KLOR-CON) 10 MEQ tablet Take 20 mEq by mouth daily.   0  . telmisartan (MICARDIS) 40 MG tablet Take 40 mg by mouth daily.    . timolol (TIMOPTIC) 0.5 % ophthalmic solution Place 1 drop into both eyes 2 (two) times daily.     .Marland Kitchenanastrozole (ARIMIDEX) 1 MG tablet Take 1 tablet (1 mg total) by mouth daily. (Patient not taking: Reported on 07/11/2015) 90 tablet 3  . chlorhexidine (HIBICLENS) 4 % external liquid Apply 15 mLs (1 application total) topically once. 120 mL 0   No current facility-administered medications for this encounter.    Physical Findings:  height is 5' 5.5" (1.664 m) and weight is 191 lb 4.8 oz (86.773 kg). Her oral temperature is 98.2 F (36.8 C). Her blood pressure is 101/66 and her pulse is 97. Her respiration is 20 and oxygen saturation is 100%.   Pain scale 0/10 In general this is a well appearing Caucasian female in no acute distress. She's alert and oriented x4 and appropriate throughout the examination. Cardiopulmonary assessment is negative for acute distress and she exhibits normal effort. Her right breast is evaluated, and there is a horizontal incision on lateral aspect of her breast in the upper outer quadrant on the right. There is also small incision from her lymph node assessment. These are healing well without evidence of cellulitic streaking bleeding or separation. No palpable edema is noted, and no lymphedema of the right upper extremity is appreciated.   Lab Findings: Lab Results  Component Value Date   WBC 7.6 06/01/2015   HGB 14.1 06/01/2015    HCT 40.7 06/01/2015   MCV 87.7 06/01/2015   PLT 280 06/01/2015     Radiographic Findings: Nm Sentinel Node Inj-no Rpt (breast)  06/20/2015  CLINICAL DATA: Cancer right breast Sulfur colloid was injected intradermally by the nuclear medicine technologist for breast cancer sentinel node localization.   Mm Breast Surgical Specimen  06/20/2015  CLINICAL DATA:  Specimen radiograph status post right breast lumpectomy for invasive ductal carcinoma. EXAM: SPECIMEN RADIOGRAPH OF THE RIGHT BREAST COMPARISON:  Previous exam(s). FINDINGS: Status post excision of the right breast. The radioactive seed and biopsy marker clip are present, completely intact, and were marked for pathology. These findings were communicated with the OR at 8:25 a.m. IMPRESSION: Specimen radiograph of the right breast. Electronically Signed  By: Ammie Ferrier M.D.   On: 06/20/2015 08:28   Mm Rt Radioactive Seed Loc Mammo Guide  06/16/2015  CLINICAL DATA:  Patient presents for radioactive seed localization of right breast cancer. EXAM: MAMMOGRAPHIC GUIDED RADIOACTIVE SEED LOCALIZATION OF THE RIGHT BREAST COMPARISON:  Previous exam(s). FINDINGS: Patient presents for radioactive seed localization prior to lumpectomy. I met with the patient and we discussed the procedure of seed localization including benefits and alternatives. We discussed the high likelihood of a successful procedure. We discussed the risks of the procedure including infection, bleeding, tissue injury and further surgery. We discussed the low dose of radioactivity involved in the procedure. Informed, written consent was given. The usual time-out protocol was performed immediately prior to the procedure. Using mammographic guidance, sterile technique, 1% lidocaine and an I-125 radioactive seed, mass and associated clip in the lateral portion of the right breast were localized using a lateral approach. The follow-up mammogram images confirm the seed in the expected  location and were marked for Dr. [name]. Follow-up survey of the patient confirms presence of the radioactive seed. Order number of I-125 seed:  [id number]. Total activity:  8.110 millicuries  Reference Date: 04/21/2015 The patient tolerated the procedure well and was released from the [facility]. She was given instructions regarding seed removal. IMPRESSION: Radioactive seed localization of the right breast. No apparent complications. Electronically Signed   By: Nolon Nations M.D.   On: 06/16/2015 13:38    Impression:   Stage I ER/PR positive, HER2 negative invasive ductal carcinoma of the right breast.  Plan: The patient is evaluated and seen again with myself and Dr. [name]. We discussed the role of radiation in decreasing local failures in patients who undergo lumpectomy. We discussed the process of simulation and the placement tattoos. We discussed 4 weeks of treatment as an outpatient to total 20 fractions. We discussed the possibility of asymptomatic lung damage. We discussed the low likelihood of secondary malignancies. We discussed the possible side effects including but not limited to skin redness, fatigue, permanent skin darkening, and breast swelling. This procedure has been fully reviewed with the patient and written informed consent has been obtained. She has her simulation appointment scheduled 07/15/2015 at 8 am.   The above documentation reflects my direct findings during this shared patient visit. Please see the separate note by Dr. [name] on this date for the remainder of the patient's plan of care.    Carola Rhine, PAC    This document serves as a record of services personally performed by Shona Simpson, PAC and Kyung Rudd, MD. It was created on their behalf by  Lendon Collar, a trained medical scribe. The creation of this record is based on the scribe's personal observations and the provider's statements to them. This document has been checked and approved by the  attending provider.

## 2015-07-11 NOTE — Progress Notes (Signed)
Please see the Nurse Progress Note in the MD Initial Consult Encounter for this patient. 

## 2015-07-15 ENCOUNTER — Ambulatory Visit
Admission: RE | Admit: 2015-07-15 | Discharge: 2015-07-15 | Disposition: A | Discharge status: Home / Self Care | Payer: Medicare Other | Source: Ambulatory Visit | Attending: Radiation Oncology | Admitting: Radiation Oncology

## 2015-07-15 DIAGNOSIS — Z510 Encounter for antineoplastic radiation therapy: Secondary | ICD-10-CM | POA: Diagnosis not present

## 2015-07-15 DIAGNOSIS — C50511 Malignant neoplasm of lower-outer quadrant of right female breast: Secondary | ICD-10-CM

## 2015-07-18 NOTE — Progress Notes (Signed)
  Radiation Oncology         [phone] ________________________________  Name: Tiffany Thompson MRN: [id number]  Date: 07/15/2015  DOB: 10/18/1950  Optical Surface Tracking Plan:  Since intensity modulated radiotherapy (IMRT) and 3D conformal radiation treatment methods are predicated on accurate and precise positioning for treatment, intrafraction motion monitoring is medically necessary to ensure accurate and safe treatment delivery.  The ability to quantify intrafraction motion without excessive ionizing radiation dose can only be performed with optical surface tracking. Accordingly, surface imaging offers the opportunity to obtain 3D measurements of patient position throughout IMRT and 3D treatments without excessive radiation exposure.  I am ordering optical surface tracking for this patient's upcoming course of radiotherapy. ________________________________  Kyung Rudd, MD 07/18/2015 5:44 PM    Reference:   Particia Jasper, et al. Surface imaging-based analysis of intrafraction motion for breast radiotherapy patients.Journal of [place], n. 6, nov. 2014. ISSN [id number].   Available at: <http://www.jacmp.org/index.php/jacmp/article/view/4957>.

## 2015-07-18 NOTE — Progress Notes (Signed)
  Radiation Oncology         [phone] ________________________________  Name: MIAMOR PIGOTT MRN: [id number]  Date: 07/15/2015  DOB: 1950/03/15   DIAGNOSIS:     ICD-9-CM ICD-10-CM   1. Breast cancer of lower-outer quadrant of right female breast ([place]) 174.5 C50.511     SIMULATION AND TREATMENT PLANNING NOTE  The patient presented for simulation prior to beginning her course of radiation treatment for her diagnosis of Right-sided breast cancer. The patient was placed in a supine position on a breast board. A customized vac-lock bag was constructed and this complex treatment device will be used on a daily basis during her treatment. In this fashion, a CT scan was obtained through the chest area and an isocenter was placed near the chest wall within the breast.  The patient will be planned to receive a course of radiation initially to a dose of 42.5 Gy. This will consist of a whole breast radiotherapy technique. To accomplish this, 2 customized blocks have been designed which will correspond to medial and lateral whole breast tangent fields. This treatment will be accomplished at 2.5 Gy per fraction. A forward planning technique will also be evaluated to determine if this approach improves the plan. It is anticipated that the patient will then receive a 7.5 Gy boost to the seroma cavity which has been contoured. This will be accomplished at 2.5 Gy per fraction.   This initial treatment will consist of a 3-D conformal technique. The seroma has been contoured as the primary target structure. Additionally, dose volume histograms of both this target as well as the lungs and heart will also be evaluated. Such an approach is necessary to ensure that the target area is adequately covered while the nearby critical  normal structures are adequately spared.  Plan:  The final anticipated total dose therefore will correspond to 50 Gy.    _______________________________   Jodelle Gross, MD, PhD

## 2015-07-20 DIAGNOSIS — Z510 Encounter for antineoplastic radiation therapy: Secondary | ICD-10-CM | POA: Diagnosis not present

## 2015-07-22 ENCOUNTER — Ambulatory Visit
Admission: RE | Admit: 2015-07-22 | Discharge: 2015-07-22 | Disposition: A | Discharge status: Home / Self Care | Payer: Medicare Other | Source: Ambulatory Visit | Attending: Radiation Oncology | Admitting: Radiation Oncology

## 2015-07-22 DIAGNOSIS — C50511 Malignant neoplasm of lower-outer quadrant of right female breast: Secondary | ICD-10-CM

## 2015-07-22 DIAGNOSIS — Z510 Encounter for antineoplastic radiation therapy: Secondary | ICD-10-CM | POA: Diagnosis not present

## 2015-07-22 MED ORDER — RADIAPLEXRX EX GEL
Freq: Once | CUTANEOUS | Status: AC
  Administered 2015-07-22: 13:00:00 via TOPICAL

## 2015-07-22 MED ORDER — ALRA NON-METALLIC DEODORANT (RAD-ONC): 1.0000 "application " | Freq: Once | TOPICAL | Status: DC

## 2015-07-26 ENCOUNTER — Ambulatory Visit
Admission: RE | Admit: 2015-07-26 | Discharge: 2015-07-26 | Disposition: A | Discharge status: Home / Self Care | Payer: Medicare Other | Source: Ambulatory Visit | Attending: Radiation Oncology | Admitting: Radiation Oncology

## 2015-07-26 DIAGNOSIS — Z510 Encounter for antineoplastic radiation therapy: Secondary | ICD-10-CM | POA: Diagnosis not present

## 2015-07-27 ENCOUNTER — Ambulatory Visit
Admission: RE | Admit: 2015-07-27 | Discharge: 2015-07-27 | Disposition: A | Discharge status: Home / Self Care | Payer: Medicare Other | Source: Ambulatory Visit | Attending: Radiation Oncology | Admitting: Radiation Oncology

## 2015-07-27 DIAGNOSIS — Z510 Encounter for antineoplastic radiation therapy: Secondary | ICD-10-CM | POA: Diagnosis not present

## 2015-07-28 ENCOUNTER — Ambulatory Visit: Admission: RE | Admit: 2015-07-28 | Payer: Medicare Other | Source: Ambulatory Visit

## 2015-07-28 ENCOUNTER — Ambulatory Visit
Admission: RE | Admit: 2015-07-28 | Discharge: 2015-07-28 | Disposition: A | Discharge status: Home / Self Care | Payer: Medicare Other | Source: Ambulatory Visit | Attending: Radiation Oncology | Admitting: Radiation Oncology

## 2015-07-28 DIAGNOSIS — Z510 Encounter for antineoplastic radiation therapy: Secondary | ICD-10-CM | POA: Diagnosis not present

## 2015-07-28 NOTE — Progress Notes (Signed)
Patient education done, radiation therapy  And you book,  My business card, radiaplex gel, alra deodorant given, discussed side effects skin irritation,fatigue,pain, swelling, tenderness, increase protein in diet,stay hydrated, drink plenty water, use electric razor if shaving under arm, use skin products after rad tx and bedtime, sees MD and staffRn weekly and prn,verbal understanding,teach back given

## 2015-07-29 ENCOUNTER — Encounter: Payer: Self-pay | Admitting: Radiation Oncology

## 2015-07-29 ENCOUNTER — Ambulatory Visit
Admission: RE | Admit: 2015-07-29 | Discharge: 2015-07-29 | Disposition: A | Discharge status: Home / Self Care | Payer: Medicare Other | Source: Ambulatory Visit | Attending: Radiation Oncology | Admitting: Radiation Oncology

## 2015-07-29 VITALS — BP 108/82 | HR 86 | Temp 97.6°F | Resp 20 | Wt 192.0 lb

## 2015-07-29 DIAGNOSIS — C50511 Malignant neoplasm of lower-outer quadrant of right female breast: Secondary | ICD-10-CM | POA: Insufficient documentation

## 2015-07-29 DIAGNOSIS — Z510 Encounter for antineoplastic radiation therapy: Secondary | ICD-10-CM | POA: Diagnosis not present

## 2015-07-29 MED ORDER — ALRA NON-METALLIC DEODORANT (RAD-ONC)
1.0000 "application " | Freq: Once | TOPICAL | Status: AC
  Administered 2015-07-29: 1 via TOPICAL

## 2015-07-29 NOTE — Progress Notes (Signed)
Department of Radiation Oncology  Phone:  [phone] Fax:        [phone]  Weekly Treatment Note    Name: Tiffany Thompson Date: 07/29/2015 MRN: [id number] DOB: [birth year]   Diagnosis:     ICD-9-CM ICD-10-CM   1. Breast cancer of lower-outer quadrant of right female breast ([facility]) 174.5 [id number] non-metallic deodorant (ALRA) 1 application     Current dose: 10 Gy  Current fraction: 4   MEDICATIONS: Current Outpatient Prescriptions  Medication Sig Dispense Refill  . acetaminophen (TYLENOL) 500 MG tablet Take 500 mg by mouth as needed for mild pain.    Marland Kitchen atorvastatin (LIPITOR) 20 MG tablet Take 20 mg by mouth daily.    . Calcium Carb-Cholecalciferol (CALCIUM + D3) 600-200 MG-UNIT TABS Take 1 tablet by mouth 2 (two) times daily.    . chlorhexidine (HIBICLENS) 4 % external liquid Apply 15 mLs (1 application total) topically once. 120 mL 0  . clonazePAM (KLONOPIN) 1 MG tablet Take 0.5-1 mg by mouth 2 (two) times daily as needed for anxiety.     . hyaluronate sodium (RADIAPLEXRX) GEL Apply 1 application topically 2 (two) times daily.    . hydrochlorothiazide (HYDRODIURIL) 25 MG tablet Take 25 mg by mouth daily.    Marland Kitchen ibuprofen (ADVIL,MOTRIN) 200 MG tablet Take 200 mg by mouth every 6 (six) hours as needed.    . latanoprost (XALATAN) 0.005 % ophthalmic solution Place 1 drop into the left eye at bedtime.     Marland Kitchen levothyroxine (SYNTHROID, LEVOTHROID) 150 MCG tablet Take 150 mcg by mouth daily before breakfast.  0  . non-metallic deodorant (ALRA) MISC Apply 1 application topically daily as needed.    . Omega-3 Fatty Acids (FISH OIL) 1000 MG CAPS Take 2,000 mg by mouth 2 (two) times daily.     Marland Kitchen OVER THE COUNTER MEDICATION Take 2,000 mg by mouth daily. Cinnamon 1000mg     . potassium chloride (K-DUR,KLOR-CON) 10 MEQ tablet Take 20 mEq by mouth daily.   0  . telmisartan (MICARDIS) 40 MG tablet Take 40 mg by mouth daily.    . timolol (TIMOPTIC) 0.5 % ophthalmic solution Place 1 drop  into both eyes 2 (two) times daily.     Marland Kitchen anastrozole (ARIMIDEX) 1 MG tablet Take 1 tablet (1 mg total) by mouth daily. (Patient not taking: Reported on 07/11/2015) 90 tablet 3   Current Facility-Administered Medications  Medication Dose Route Frequency Provider Last Rate Last Dose  . non-metallic deodorant (ALRA) 1 application  1 application Topical Once Kyung Rudd, MD         ALLERGIES: Brimonidine; Dorzolamide; and Dorzolamide hcl-timolol mal pf   LABORATORY DATA:  Lab Results  Component Value Date   WBC 7.6 06/01/2015   HGB 14.1 06/01/2015   HCT 40.7 06/01/2015   MCV 87.7 06/01/2015   PLT 280 06/01/2015   Lab Results  Component Value Date   NA 136 06/13/2015   K 3.8 06/13/2015   CL 96* 06/13/2015   CO2 28 06/13/2015   Lab Results  Component Value Date   ALT 21 06/01/2015   AST 15 06/01/2015   ALKPHOS 141 06/01/2015   BILITOT 0.48 06/01/2015     NARRATIVE: [name] was seen today for weekly treatment management. The chart was checked and the patient's films were reviewed.  Weekly rad txs, right breast, very mild erythema,skin intact,using radiaplex bid, , glue still on some of her incision , nipple inverted some, appetite good, started some exercise walking, silver sneakers  card, BP 108/82 mmHg  Pulse 86  Temp(Src) 97.6 F (36.4 C) (Oral)  Resp 20  Wt 192 lb (87.091 kg)  Wt Readings from Last 3 Encounters:  07/29/15 192 lb (87.091 kg)  07/11/15 191 lb 4.8 oz (86.773 kg)  07/06/15 189 lb 6.4 oz (85.911 kg)   4:15 PM     PHYSICAL EXAMINATION: weight is 192 lb (87.091 kg). Her oral temperature is 97.6 F (36.4 C). Her blood pressure is 108/82 and her pulse is 86. Her respiration is 20.        ASSESSMENT: The patient is doing satisfactorily with treatment.  PLAN: We will continue with the patient's radiation treatment as planned.

## 2015-07-29 NOTE — Progress Notes (Signed)
Weekly rad txs, right breast, very mild erythema,skin intact,using radiaplex bid, , glue still on some of her incision , nipple inverted some, appetite good, started some exercise walking, silver sneakers card, BP 108/82 mmHg  Pulse 86  Temp(Src) 97.6 F (36.4 C) (Oral)  Resp 20  Wt 192 lb (87.091 kg)  Wt Readings from Last 3 Encounters:  07/29/15 192 lb (87.091 kg)  07/11/15 191 lb 4.8 oz (86.773 kg)  07/06/15 189 lb 6.4 oz (85.911 kg)   3:52 PM

## 2015-07-29 NOTE — Addendum Note (Signed)
Encounter addended by: Jacqulyn Liner, RN on: 07/29/2015  4:45 PM<BR>     Documentation filed: Inpatient MAR

## 2015-08-01 ENCOUNTER — Ambulatory Visit
Admission: RE | Admit: 2015-08-01 | Discharge: 2015-08-01 | Disposition: A | Discharge status: Home / Self Care | Payer: Medicare Other | Source: Ambulatory Visit | Attending: Radiation Oncology | Admitting: Radiation Oncology

## 2015-08-01 DIAGNOSIS — Z510 Encounter for antineoplastic radiation therapy: Secondary | ICD-10-CM | POA: Diagnosis not present

## 2015-08-02 ENCOUNTER — Ambulatory Visit
Admission: RE | Admit: 2015-08-02 | Discharge: 2015-08-02 | Disposition: A | Discharge status: Home / Self Care | Payer: Medicare Other | Source: Ambulatory Visit | Attending: Radiation Oncology | Admitting: Radiation Oncology

## 2015-08-02 DIAGNOSIS — Z510 Encounter for antineoplastic radiation therapy: Secondary | ICD-10-CM | POA: Diagnosis not present

## 2015-08-03 ENCOUNTER — Ambulatory Visit
Admission: RE | Admit: 2015-08-03 | Discharge: 2015-08-03 | Disposition: A | Discharge status: Home / Self Care | Payer: Medicare Other | Source: Ambulatory Visit | Attending: Radiation Oncology | Admitting: Radiation Oncology

## 2015-08-03 DIAGNOSIS — Z510 Encounter for antineoplastic radiation therapy: Secondary | ICD-10-CM | POA: Diagnosis not present

## 2015-08-04 ENCOUNTER — Ambulatory Visit
Admission: RE | Admit: 2015-08-04 | Discharge: 2015-08-04 | Disposition: A | Discharge status: Home / Self Care | Payer: Medicare Other | Source: Ambulatory Visit | Attending: Radiation Oncology | Admitting: Radiation Oncology

## 2015-08-04 ENCOUNTER — Telehealth: Payer: Self-pay | Admitting: *Deleted

## 2015-08-04 DIAGNOSIS — Z510 Encounter for antineoplastic radiation therapy: Secondary | ICD-10-CM | POA: Diagnosis not present

## 2015-08-04 NOTE — Telephone Encounter (Signed)
  Oncology Nurse Navigator Documentation    Navigator Encounter Type: Telephone (08/04/15 1100) Telephone: Outgoing Call (08/04/15 1100)         Patient Visit Type: [id number] (08/04/15 1100) Treatment Phase: First Radiation Tx (08/04/15 1100) Barriers/Navigation Needs: No barriers at this time;No Questions;No Needs (08/04/15 1100)   Interventions: None required (08/04/15 1100)  Relate doing well and without complaints.          Acuity: Level 2 (08/04/15 1100)         Time Spent with Patient: 15 (08/04/15 1100)

## 2015-08-05 ENCOUNTER — Encounter: Payer: Self-pay | Admitting: Radiation Oncology

## 2015-08-05 ENCOUNTER — Ambulatory Visit
Admission: RE | Admit: 2015-08-05 | Discharge: 2015-08-05 | Disposition: A | Discharge status: Home / Self Care | Payer: Medicare Other | Source: Ambulatory Visit | Attending: Radiation Oncology | Admitting: Radiation Oncology

## 2015-08-05 VITALS — BP 116/75 | HR 88 | Temp 98.6°F | Resp 16 | Wt 193.1 lb

## 2015-08-05 DIAGNOSIS — Z510 Encounter for antineoplastic radiation therapy: Secondary | ICD-10-CM | POA: Diagnosis not present

## 2015-08-05 DIAGNOSIS — C50511 Malignant neoplasm of lower-outer quadrant of right female breast: Secondary | ICD-10-CM

## 2015-08-05 NOTE — Progress Notes (Addendum)
Weekly rad txs right breast 9/20 completed, mild erythema,skin intact, using radaiplex bid, appetite good, nopain 3:42 PM BP 116/75 mmHg  Pulse 88  Temp(Src) 98.6 F (37 C) (Oral)  Resp 16  Wt 193 lb 1.6 oz (87.59 kg)  Wt Readings from Last 3 Encounters:  08/05/15 193 lb 1.6 oz (87.59 kg)  07/29/15 192 lb (87.091 kg)  07/11/15 191 lb 4.8 oz (86.773 kg)

## 2015-08-07 NOTE — Progress Notes (Signed)
Department of Radiation Oncology  Phone:  [phone] Fax:        [phone]  Weekly Treatment Note    Name: Tiffany Thompson Date: 08/07/2015 MRN: [id number] DOB: [birth year]   Diagnosis:     ICD-9-CM ICD-10-CM   1. Breast cancer of lower-outer quadrant of right female breast ([place]) 174.5 C50.511      Current dose: 22.5 Gy  Current fraction: 9   MEDICATIONS: Current Outpatient Prescriptions  Medication Sig Dispense Refill  . acetaminophen (TYLENOL) 500 MG tablet Take 500 mg by mouth as needed for mild pain.    Marland Kitchen atorvastatin (LIPITOR) 20 MG tablet Take 20 mg by mouth daily.    . Calcium Carb-Cholecalciferol (CALCIUM + D3) 600-200 MG-UNIT TABS Take 1 tablet by mouth 2 (two) times daily.    . chlorhexidine (HIBICLENS) 4 % external liquid Apply 15 mLs (1 application total) topically once. 120 mL 0  . clonazePAM (KLONOPIN) 1 MG tablet Take 0.5-1 mg by mouth 2 (two) times daily as needed for anxiety.     . hyaluronate sodium (RADIAPLEXRX) GEL Apply 1 application topically 2 (two) times daily.    . hydrochlorothiazide (HYDRODIURIL) 25 MG tablet Take 25 mg by mouth daily.    Marland Kitchen ibuprofen (ADVIL,MOTRIN) 200 MG tablet Take 200 mg by mouth every 6 (six) hours as needed.    . latanoprost (XALATAN) 0.005 % ophthalmic solution Place 1 drop into the left eye at bedtime.     Marland Kitchen levothyroxine (SYNTHROID, LEVOTHROID) 150 MCG tablet Take 150 mcg by mouth daily before breakfast.  0  . non-metallic deodorant (ALRA) MISC Apply 1 application topically daily as needed.    . Omega-3 Fatty Acids (FISH OIL) 1000 MG CAPS Take 2,000 mg by mouth 2 (two) times daily.     Marland Kitchen OVER THE COUNTER MEDICATION Take 2,000 mg by mouth daily. Cinnamon 1000mg     . potassium chloride (K-DUR,KLOR-CON) 10 MEQ tablet Take 20 mEq by mouth daily.   0  . telmisartan (MICARDIS) 40 MG tablet Take 40 mg by mouth daily.    . timolol (TIMOPTIC) 0.5 % ophthalmic solution Place 1 drop into both eyes 2 (two) times daily.     Marland Kitchen  anastrozole (ARIMIDEX) 1 MG tablet Take 1 tablet (1 mg total) by mouth daily. (Patient not taking: Reported on 07/11/2015) 90 tablet 3   No current facility-administered medications for this encounter.     ALLERGIES: Brimonidine; Dorzolamide; and Dorzolamide hcl-timolol mal pf   LABORATORY DATA:  Lab Results  Component Value Date   WBC 7.6 06/01/2015   HGB 14.1 06/01/2015   HCT 40.7 06/01/2015   MCV 87.7 06/01/2015   PLT 280 06/01/2015   Lab Results  Component Value Date   NA 136 06/13/2015   K 3.8 06/13/2015   CL 96* 06/13/2015   CO2 28 06/13/2015   Lab Results  Component Value Date   ALT 21 06/01/2015   AST 15 06/01/2015   ALKPHOS 141 06/01/2015   BILITOT 0.48 06/01/2015     NARRATIVE: [name] was seen today for weekly treatment management. The chart was checked and the patient's films were reviewed.  Weekly rad txs right breast 9/20 completed, mild erythema,skin intact, using radaiplex bid, appetite good, nopain 1:52 PM BP 116/75 mmHg  Pulse 88  Temp(Src) 98.6 F (37 C) (Oral)  Resp 16  Wt 193 lb 1.6 oz (87.59 kg)  Wt Readings from Last 3 Encounters:  08/05/15 193 lb 1.6 oz (87.59 kg)  07/29/15 192  lb (87.091 kg)  07/11/15 191 lb 4.8 oz (86.773 kg)    PHYSICAL EXAMINATION: weight is 193 lb 1.6 oz (87.59 kg). Her oral temperature is 98.6 F (37 C). Her blood pressure is 116/75 and her pulse is 88. Her respiration is 16.        ASSESSMENT: The patient is doing satisfactorily with treatment.  PLAN: We will continue with the patient's radiation treatment as planned.

## 2015-08-08 ENCOUNTER — Ambulatory Visit
Admission: RE | Admit: 2015-08-08 | Discharge: 2015-08-08 | Disposition: A | Discharge status: Home / Self Care | Payer: Medicare Other | Source: Ambulatory Visit | Attending: Radiation Oncology | Admitting: Radiation Oncology

## 2015-08-08 DIAGNOSIS — Z510 Encounter for antineoplastic radiation therapy: Secondary | ICD-10-CM | POA: Diagnosis not present

## 2015-08-09 ENCOUNTER — Encounter: Payer: Self-pay | Admitting: Radiation Oncology

## 2015-08-09 ENCOUNTER — Ambulatory Visit
Admission: RE | Admit: 2015-08-09 | Discharge: 2015-08-09 | Disposition: A | Discharge status: Home / Self Care | Payer: Medicare Other | Source: Ambulatory Visit | Attending: Radiation Oncology | Admitting: Radiation Oncology

## 2015-08-09 DIAGNOSIS — Z510 Encounter for antineoplastic radiation therapy: Secondary | ICD-10-CM | POA: Diagnosis not present

## 2015-08-10 ENCOUNTER — Ambulatory Visit
Admission: RE | Admit: 2015-08-10 | Discharge: 2015-08-10 | Disposition: A | Discharge status: Home / Self Care | Payer: Medicare Other | Source: Ambulatory Visit | Attending: Radiation Oncology | Admitting: Radiation Oncology

## 2015-08-10 DIAGNOSIS — Z510 Encounter for antineoplastic radiation therapy: Secondary | ICD-10-CM | POA: Diagnosis not present

## 2015-08-11 ENCOUNTER — Ambulatory Visit
Admission: RE | Admit: 2015-08-11 | Discharge: 2015-08-11 | Disposition: A | Discharge status: Home / Self Care | Payer: Medicare Other | Source: Ambulatory Visit | Attending: Radiation Oncology | Admitting: Radiation Oncology

## 2015-08-11 DIAGNOSIS — Z510 Encounter for antineoplastic radiation therapy: Secondary | ICD-10-CM | POA: Diagnosis not present

## 2015-08-12 ENCOUNTER — Encounter: Payer: Self-pay | Admitting: Radiation Oncology

## 2015-08-12 ENCOUNTER — Ambulatory Visit
Admission: RE | Admit: 2015-08-12 | Discharge: 2015-08-12 | Disposition: A | Discharge status: Home / Self Care | Payer: Medicare Other | Source: Ambulatory Visit | Attending: Radiation Oncology | Admitting: Radiation Oncology

## 2015-08-12 ENCOUNTER — Ambulatory Visit: Payer: Medicare Other | Admitting: Radiation Oncology

## 2015-08-12 VITALS — BP 113/74 | HR 91 | Temp 97.7°F | Resp 16 | Wt 161.9 lb

## 2015-08-12 DIAGNOSIS — Z510 Encounter for antineoplastic radiation therapy: Secondary | ICD-10-CM | POA: Diagnosis not present

## 2015-08-12 DIAGNOSIS — C50511 Malignant neoplasm of lower-outer quadrant of right female breast: Secondary | ICD-10-CM

## 2015-08-12 NOTE — Progress Notes (Signed)
Weekly rad txs right breast 14/20 completed, mild erythema, skin intact, using radiaplex bid, appetite good, no c/o BP 113/74 mmHg  Pulse 91  Temp(Src) 97.7 F (36.5 C) (Oral)  Resp 16  Wt 161 lb 14.4 oz (73.437 kg)  Wt Readings from Last 3 Encounters:  08/12/15 161 lb 14.4 oz (73.437 kg)  08/05/15 193 lb 1.6 oz (87.59 kg)  07/29/15 192 lb (87.091 kg)

## 2015-08-12 NOTE — Progress Notes (Signed)
Department of Radiation Oncology  Phone:  [phone] Fax:        [phone]  Weekly Treatment Note    Name: Tiffany Thompson Date: 08/13/2015 MRN: [id number] DOB: 04-08-1950   Diagnosis:     ICD-9-CM ICD-10-CM   1. Breast cancer of lower-outer quadrant of right female breast ([place]) 174.5 C50.511      Current dose: 35 Gy  Current fraction: 14   MEDICATIONS: Current Outpatient Prescriptions  Medication Sig Dispense Refill  . acetaminophen (TYLENOL) 500 MG tablet Take 500 mg by mouth as needed for mild pain.    Marland Kitchen atorvastatin (LIPITOR) 20 MG tablet Take 20 mg by mouth daily.    . Calcium Carb-Cholecalciferol (CALCIUM + D3) 600-200 MG-UNIT TABS Take 1 tablet by mouth 2 (two) times daily.    . chlorhexidine (HIBICLENS) 4 % external liquid Apply 15 mLs (1 application total) topically once. 120 mL 0  . clonazePAM (KLONOPIN) 1 MG tablet Take 0.5-1 mg by mouth 2 (two) times daily as needed for anxiety.     . hyaluronate sodium (RADIAPLEXRX) GEL Apply 1 application topically 2 (two) times daily.    . hydrochlorothiazide (HYDRODIURIL) 25 MG tablet Take 25 mg by mouth daily.    Marland Kitchen ibuprofen (ADVIL,MOTRIN) 200 MG tablet Take 200 mg by mouth every 6 (six) hours as needed.    . latanoprost (XALATAN) 0.005 % ophthalmic solution Place 1 drop into the left eye at bedtime.     Marland Kitchen levothyroxine (SYNTHROID, LEVOTHROID) 150 MCG tablet Take 150 mcg by mouth daily before breakfast.  0  . non-metallic deodorant (ALRA) MISC Apply 1 application topically daily as needed.    . Omega-3 Fatty Acids (FISH OIL) 1000 MG CAPS Take 2,000 mg by mouth 2 (two) times daily.     Marland Kitchen OVER THE COUNTER MEDICATION Take 2,000 mg by mouth daily. Cinnamon 1000mg     . potassium chloride (K-DUR,KLOR-CON) 10 MEQ tablet Take 20 mEq by mouth daily.   0  . telmisartan (MICARDIS) 40 MG tablet Take 40 mg by mouth daily.    . timolol (TIMOPTIC) 0.5 % ophthalmic solution Place 1 drop into both eyes 2 (two) times daily.     Marland Kitchen  anastrozole (ARIMIDEX) 1 MG tablet Take 1 tablet (1 mg total) by mouth daily. (Patient not taking: Reported on 07/11/2015) 90 tablet 3   No current facility-administered medications for this encounter.     ALLERGIES: Brimonidine; Dorzolamide; and Dorzolamide hcl-timolol mal pf   LABORATORY DATA:  Lab Results  Component Value Date   WBC 7.6 06/01/2015   HGB 14.1 06/01/2015   HCT 40.7 06/01/2015   MCV 87.7 06/01/2015   PLT 280 06/01/2015   Lab Results  Component Value Date   NA 136 06/13/2015   K 3.8 06/13/2015   CL 96* 06/13/2015   CO2 28 06/13/2015   Lab Results  Component Value Date   ALT 21 06/01/2015   AST 15 06/01/2015   ALKPHOS 141 06/01/2015   BILITOT 0.48 06/01/2015     NARRATIVE: Tiffany Thompson was seen today for weekly treatment management. The chart was checked and the patient's films were reviewed.  Weekly rad txs right breast 14/20 completed. She is using radiaplex bid and has a good appetite.  9:29 PM BP 113/74 mmHg  Pulse 91  Temp(Src) 97.7 F (36.5 C) (Oral)  Resp 16  Wt 161 lb 14.4 oz (73.437 kg)  Wt Readings from Last 3 Encounters:  08/12/15 161 lb 14.4 oz (73.437 kg)  08/05/15 193 lb 1.6 oz (87.59 kg)  07/29/15 192 lb (87.091 kg)    PHYSICAL EXAMINATION: weight is 161 lb 14.4 oz (73.437 kg). Her oral temperature is 97.7 F (36.5 C). Her blood pressure is 113/74 and her pulse is 91. Her respiration is 16.        ASSESSMENT: The patient is doing satisfactorily with treatment.  PLAN: We will continue with the patient's radiation treatment as planned.   ------------------------------------------------  Jodelle Gross, MD, PhD  This document serves as a record of services personally performed by Shona Simpson, PA-C and Kyung Rudd, MD. It was created on their behalf by Darcus Austin, a trained medical scribe. The creation of this record is based on the scribe's personal observations and the providers' statements to them. This document has been  checked and approved by the attending provider.

## 2015-08-15 ENCOUNTER — Ambulatory Visit
Admission: RE | Admit: 2015-08-15 | Discharge: 2015-08-15 | Disposition: A | Discharge status: Home / Self Care | Payer: Medicare Other | Source: Ambulatory Visit | Attending: Radiation Oncology | Admitting: Radiation Oncology

## 2015-08-15 DIAGNOSIS — Z510 Encounter for antineoplastic radiation therapy: Secondary | ICD-10-CM | POA: Diagnosis not present

## 2015-08-16 ENCOUNTER — Ambulatory Visit
Admission: RE | Admit: 2015-08-16 | Discharge: 2015-08-16 | Disposition: A | Discharge status: Home / Self Care | Payer: Medicare Other | Source: Ambulatory Visit | Attending: Radiation Oncology | Admitting: Radiation Oncology

## 2015-08-16 DIAGNOSIS — Z510 Encounter for antineoplastic radiation therapy: Secondary | ICD-10-CM | POA: Diagnosis not present

## 2015-08-17 ENCOUNTER — Ambulatory Visit
Admission: RE | Admit: 2015-08-17 | Discharge: 2015-08-17 | Disposition: A | Discharge status: Home / Self Care | Payer: Medicare Other | Source: Ambulatory Visit | Attending: Radiation Oncology | Admitting: Radiation Oncology

## 2015-08-17 DIAGNOSIS — Z510 Encounter for antineoplastic radiation therapy: Secondary | ICD-10-CM | POA: Diagnosis not present

## 2015-08-18 ENCOUNTER — Ambulatory Visit
Admission: RE | Admit: 2015-08-18 | Discharge: 2015-08-18 | Disposition: A | Discharge status: Home / Self Care | Payer: Medicare Other | Source: Ambulatory Visit | Attending: Radiation Oncology | Admitting: Radiation Oncology

## 2015-08-18 DIAGNOSIS — Z510 Encounter for antineoplastic radiation therapy: Secondary | ICD-10-CM | POA: Diagnosis not present

## 2015-08-19 ENCOUNTER — Ambulatory Visit
Admission: RE | Admit: 2015-08-19 | Discharge: 2015-08-19 | Disposition: A | Discharge status: Home / Self Care | Payer: Medicare Other | Source: Ambulatory Visit | Attending: Radiation Oncology | Admitting: Radiation Oncology

## 2015-08-19 ENCOUNTER — Encounter: Payer: Self-pay | Admitting: Radiation Oncology

## 2015-08-19 VITALS — BP 111/90 | HR 101 | Temp 97.5°F | Resp 18 | Wt 192.6 lb

## 2015-08-19 DIAGNOSIS — C50511 Malignant neoplasm of lower-outer quadrant of right female breast: Secondary | ICD-10-CM

## 2015-08-19 DIAGNOSIS — Z510 Encounter for antineoplastic radiation therapy: Secondary | ICD-10-CM | POA: Diagnosis not present

## 2015-08-19 NOTE — Progress Notes (Addendum)
Weekly rad txs right breast 19/20 completed, erythema on breast, and dermatits rsh on top  mid breast/chest  Just came up past couple days, slight itching, using radaiplex bid,  Not taking the arimidex as yet, appetite good, 1 mo appt with Bryson Ha given  3:47 PM BP 111/90 mmHg  Pulse 101  Temp(Src) 97.5 F (36.4 C) (Oral)  Resp 18  Wt 192 lb 9.6 oz (87.363 kg)  Wt Readings from Last 3 Encounters:  08/19/15 192 lb 9.6 oz (87.363 kg)  08/12/15 161 lb 14.4 oz (73.437 kg)  08/05/15 193 lb 1.6 oz (87.59 kg)

## 2015-08-19 NOTE — Progress Notes (Signed)
  Radiation Oncology         [phone]   Name: Tiffany MARCHEL MRN: [id number]   Date: 08/19/2015  DOB: [birth year]   Weekly Radiation Therapy Management    ICD-9-CM ICD-10-CM   1. Breast cancer of lower-outer quadrant of right female breast ([place]) 174.5 C50.511     Current Dose: 47.5 Gy  Planned Dose:  50 Gy  Narrative The patient presents for routine under treatment assessment. Weekly rad txs right breast 19/20 completed. She reports erythema and a rash of the right breast. She states the rash appeared in the past couple days with some pruritus. She is using radaiplex bid. She is not taking the arimidex as of yet. She has a good appetite. Set-up films were reviewed. The chart was checked.  Physical Findings  weight is 192 lb 9.6 oz (87.363 kg). Her oral temperature is 97.5 F (36.4 C). Her blood pressure is 111/90 and her pulse is 101. Her respiration is 18. . Weight essentially stable.  No significant changes.  Impression The patient is tolerating radiation.  Plan The patient completes radiation treatment next Monday. The patient will follow up in 1 month.    Sheral Apley Tammi Klippel, M.D.  This document serves as a record of services personally performed by Tyler Pita, MD. It was created on his behalf by Darcus Austin, a trained medical scribe. The creation of this record is based on the scribe's personal observations and the provider's statements to them. This document has been checked and approved by the attending provider.

## 2015-08-22 ENCOUNTER — Encounter: Payer: Self-pay | Admitting: Radiation Oncology

## 2015-08-22 ENCOUNTER — Telehealth: Payer: Self-pay | Admitting: *Deleted

## 2015-08-22 ENCOUNTER — Other Ambulatory Visit: Payer: Self-pay | Admitting: *Deleted

## 2015-08-22 ENCOUNTER — Ambulatory Visit
Admission: RE | Admit: 2015-08-22 | Discharge: 2015-08-22 | Disposition: A | Discharge status: Home / Self Care | Payer: Medicare Other | Source: Ambulatory Visit | Attending: Radiation Oncology | Admitting: Radiation Oncology

## 2015-08-22 DIAGNOSIS — Z510 Encounter for antineoplastic radiation therapy: Secondary | ICD-10-CM | POA: Diagnosis not present

## 2015-08-22 DIAGNOSIS — C50511 Malignant neoplasm of lower-outer quadrant of right female breast: Secondary | ICD-10-CM

## 2015-08-22 MED ORDER — ANASTROZOLE 1 MG PO TABS: 1.0000 mg | ORAL_TABLET | Freq: Every day | ORAL | Status: DC

## 2015-08-22 NOTE — Telephone Encounter (Signed)
Spoke with patient to follow up after radiation completion.  She states she is doing well.  Encouraged her to call with any needs or concerns.

## 2015-08-23 ENCOUNTER — Other Ambulatory Visit: Payer: Self-pay | Admitting: *Deleted

## 2015-08-23 DIAGNOSIS — C50511 Malignant neoplasm of lower-outer quadrant of right female breast: Secondary | ICD-10-CM

## 2015-08-23 MED ORDER — ANASTROZOLE 1 MG PO TABS: 1.0000 mg | ORAL_TABLET | Freq: Every day | ORAL | Status: DC

## 2015-09-01 ENCOUNTER — Telehealth: Payer: Self-pay | Admitting: *Deleted

## 2015-09-01 NOTE — Telephone Encounter (Signed)
  Oncology Nurse Navigator Documentation    Navigator Encounter Type: Telephone (09/01/15 1600) Telephone: Outgoing Call (09/01/15 1600) Abnormal Finding Date: 05/12/15 (09/01/15 1600) Confirmed Diagnosis Date: 05/23/15 (09/01/15 1600)                                    Time Spent with Patient: 15 (09/01/15 1600)

## 2015-09-08 NOTE — Progress Notes (Signed)
°  Radiation Oncology         [phone] ________________________________  Name: BEUNKA MALPASS MRN: [id number]  Date: 08/22/2015  DOB: Nov 29, 1950  End of Treatment Note  Diagnosis:      ICD-9-CM ICD-10-CM   1. Breast cancer of lower-outer quadrant of right female breast ([place]) 174.5 C50.511              Indication for treatment:  Curative       Radiation treatment dates:   07/26/2015 to 08/22/2015  Site/dose:   1. The Right breast was treated to 42.5 Gy in 17 fractions at 2.5 Gy per fraction. 2. The Right breast was boosted to  7.5 Gy in 3 fractions at 2.5 Gy per fraction.   Beams/energy:    1. 3D // 15X 2. Isodose Plan // 10X, 15X, 6X  Narrative: The patient tolerated radiation treatment relatively well.  The patient experienced a rash in the treatment area with some pruritus.   Plan: The patient has completed radiation treatment. The patient will return to radiation oncology clinic for routine followup in one month. I advised them to call or return sooner if they have any questions or concerns related to their recovery or treatment.  ------------------------------------------------  Jodelle Gross, MD, PhD  This document serves as a record of services personally performed by Kyung Rudd, MD. It was created on his behalf by Arlyce Harman, a trained medical scribe. The creation of this record is based on the scribe's personal observations and the provider's statements to them. This document has been checked and approved by the attending provider.

## 2015-09-16 ENCOUNTER — Other Ambulatory Visit: Payer: Self-pay | Admitting: *Deleted

## 2015-09-16 DIAGNOSIS — C50511 Malignant neoplasm of lower-outer quadrant of right female breast: Secondary | ICD-10-CM

## 2015-09-17 NOTE — Progress Notes (Signed)
Complex simulation note  Diagnosis: Right-sided breast cancer  Narrative The patient has initially been planned to receive a course of whole breast radiation to a dose of 42.5 Gy in 17 fractions. The patient will now receive an additional boost to the seroma cavity which has been contoured. This will correspond to a boost of 7.5 Gy at 2.5 Gy per fraction. To accomplish this, an additional 4 customized blocks have been designed for this purpose. A complex isodose plan is requested to ensure that the target area is adequately covered with radiation dose and that the nearby normal structures such as the lung are adequately spared. The patient's final total dose will be 50 Gy.  ------------------------------------------------  Jodelle Gross, MD, PhD

## 2015-09-19 ENCOUNTER — Telehealth: Payer: Self-pay | Admitting: Hematology and Oncology

## 2015-09-19 ENCOUNTER — Ambulatory Visit
Admission: RE | Admit: 2015-09-19 | Discharge: 2015-09-19 | Disposition: A | Discharge status: Home / Self Care | Payer: Medicare Other | Source: Ambulatory Visit | Attending: Radiation Oncology | Admitting: Radiation Oncology

## 2015-09-19 ENCOUNTER — Encounter: Payer: Self-pay | Admitting: Radiation Oncology

## 2015-09-19 VITALS — BP 119/69 | HR 97 | Temp 98.0°F | Ht 65.5 in | Wt 190.5 lb

## 2015-09-19 DIAGNOSIS — Z923 Personal history of irradiation: Secondary | ICD-10-CM | POA: Insufficient documentation

## 2015-09-19 DIAGNOSIS — C50511 Malignant neoplasm of lower-outer quadrant of right female breast: Secondary | ICD-10-CM

## 2015-09-19 DIAGNOSIS — Z888 Allergy status to other drugs, medicaments and biological substances status: Secondary | ICD-10-CM | POA: Diagnosis not present

## 2015-09-19 DIAGNOSIS — D0511 Intraductal carcinoma in situ of right breast: Secondary | ICD-10-CM | POA: Diagnosis not present

## 2015-09-19 DIAGNOSIS — Z79899 Other long term (current) drug therapy: Secondary | ICD-10-CM | POA: Insufficient documentation

## 2015-09-19 NOTE — Telephone Encounter (Signed)
Appointment confirmed with patient. Tiffany Thompson °

## 2015-09-19 NOTE — Progress Notes (Signed)
Tiffany Thompson denies any right sided breast pain.  She has a reddish-pinkish cast to her breast.  Skin remains intact.  She reports that she still has fatigue, but is going to the gym with a friend, at least, 4 days weekly.  Arimidex started 2 weeks after the end of XRT on 09/05/15.

## 2015-09-19 NOTE — Progress Notes (Signed)
Radiation Oncology         [phone] ________________________________  Name: Tiffany Thompson MRN: [id number]  Date: 09/19/2015  DOB: 06/12/1950  Follow-Up Visit Note  CC: Aretta Nip, MD  Fanny Skates, MD  Diagnosis:   Stage IA, T1b, N0, M0, ER/PR positive invasive ductal carcinoma and DCIS of the right breast  Interval Since Last Radiation:  4 weeks  07/26/15-08/17/15: Definitive radiotherapy to the right breast to total 60 Gy in 20 fractions, with 3 of these fractions being boost to the lumpectomy site.   Narrative:  The patient returns today for routine follow-up. She did well tolerating radiotherapy with mild skin erythema.                             On review of systems, the patient states she is doing well. She denies any concerns at this time with her skin. She is doing well with antiestrogen therapy. No other complaints are noted.   ALLERGIES:  is allergic to brimonidine; dorzolamide; and dorzolamide hcl-timolol mal pf.  Meds: Current Outpatient Prescriptions  Medication Sig Dispense Refill  . acetaminophen (TYLENOL) 500 MG tablet Take 500 mg by mouth as needed for mild pain.    Marland Kitchen anastrozole (ARIMIDEX) 1 MG tablet Take 1 tablet (1 mg total) by mouth daily. 90 tablet 3  . atorvastatin (LIPITOR) 20 MG tablet Take 20 mg by mouth daily.    . Calcium Carb-Cholecalciferol (CALCIUM + D3) 600-200 MG-UNIT TABS Take 1 tablet by mouth 2 (two) times daily.    . clonazePAM (KLONOPIN) 1 MG tablet Take 0.5-1 mg by mouth 2 (two) times daily as needed for anxiety.     . hydrochlorothiazide (HYDRODIURIL) 25 MG tablet Take 25 mg by mouth daily.    Marland Kitchen ibuprofen (ADVIL,MOTRIN) 200 MG tablet Take 200 mg by mouth every 6 (six) hours as needed.    . latanoprost (XALATAN) 0.005 % ophthalmic solution Place 1 drop into the left eye at bedtime.     Marland Kitchen levothyroxine (SYNTHROID, LEVOTHROID) 150 MCG tablet Take 150 mcg by mouth daily before breakfast.  0  . non-metallic deodorant (ALRA)  MISC Apply 1 application topically daily as needed.    . Omega-3 Fatty Acids (FISH OIL) 1000 MG CAPS Take 2,000 mg by mouth 2 (two) times daily.     Marland Kitchen OVER THE COUNTER MEDICATION Take 2,000 mg by mouth daily. Cinnamon 1000mg     . potassium chloride (K-DUR,KLOR-CON) 10 MEQ tablet Take 20 mEq by mouth daily.   0  . telmisartan (MICARDIS) 40 MG tablet Take 40 mg by mouth daily.    . timolol (TIMOPTIC) 0.5 % ophthalmic solution Place 1 drop into both eyes 2 (two) times daily.      No current facility-administered medications for this encounter.     Physical Findings:  height is 5' 5.5" (1.664 m) and weight is 190 lb 8 oz (86.4 kg). Her temperature is 98 F (36.7 C). Her blood pressure is 119/69 and her pulse is 97.  In general this is a well appearing Caucasian female in no acute distress. She's alert and oriented x4 and appropriate throughout the examination. Cardiopulmonary assessment is negative for acute distress and she exhibits normal effort. The right breast is inspected and without desquamation. Her lumpectomy scar is well healed. No lymphedema of the right upper extremity is noted.     Lab Findings: Lab Results  Component Value Date   WBC 7.6 06/01/2015  HGB 14.1 06/01/2015   HCT 40.7 06/01/2015   MCV 87.7 06/01/2015   PLT 280 06/01/2015     Radiographic Findings: No results found.  Impression/Plan: 1.  Stage IA, T1b, N0, M0, ER/PR positive invasive ductal carcinoma and DCIS of the right breast. The patient has completed radiotherapy. She is doing well and her skin continues to improve. She will follow up with Dr. Lindi Adie in November for her anastrazole, and with survivorship in September 2017 also. We would be happy to see the patient in the future if she has any questions or concerns regarding her previous radiotherapy.     Carola Rhine, PAC

## 2015-09-20 ENCOUNTER — Telehealth: Payer: Self-pay | Admitting: Adult Health

## 2015-09-20 NOTE — Telephone Encounter (Signed)
Appointment confirmed with patient. Tiffany Thompson °

## 2015-11-18 ENCOUNTER — Encounter: Payer: Medicare Other | Admitting: Adult Health

## 2015-11-21 ENCOUNTER — Ambulatory Visit (HOSPITAL_BASED_OUTPATIENT_CLINIC_OR_DEPARTMENT_OTHER): Payer: Medicare Other | Admitting: Adult Health

## 2015-11-21 ENCOUNTER — Encounter: Payer: Self-pay | Admitting: Adult Health

## 2015-11-21 VITALS — BP 131/80 | HR 103 | Temp 97.7°F | Resp 19 | Wt 193.6 lb

## 2015-11-21 DIAGNOSIS — Z23 Encounter for immunization: Secondary | ICD-10-CM | POA: Diagnosis not present

## 2015-11-21 DIAGNOSIS — Z170 Estrogen receptor positive status [ER+]: Secondary | ICD-10-CM | POA: Diagnosis not present

## 2015-11-21 DIAGNOSIS — R5383 Other fatigue: Secondary | ICD-10-CM

## 2015-11-21 DIAGNOSIS — M8580 Other specified disorders of bone density and structure, unspecified site: Secondary | ICD-10-CM

## 2015-11-21 DIAGNOSIS — C50511 Malignant neoplasm of lower-outer quadrant of right female breast: Secondary | ICD-10-CM | POA: Diagnosis not present

## 2015-11-21 DIAGNOSIS — Z79811 Long term (current) use of aromatase inhibitors: Secondary | ICD-10-CM

## 2015-11-21 MED ORDER — INFLUENZA VAC SPLIT QUAD 0.5 ML IM SUSY
0.5000 mL | PREFILLED_SYRINGE | Freq: Once | INTRAMUSCULAR | Status: AC
  Administered 2015-11-21: 0.5 mL via INTRAMUSCULAR
  Filled 2015-11-21: qty 0.5

## 2015-11-21 NOTE — Progress Notes (Signed)
CLINIC:  Survivorship   REASON FOR VISIT:  Routine follow-up post-treatment for a recent history of breast cancer.  BRIEF ONCOLOGIC HISTORY:    Breast cancer of lower-outer quadrant of right female breast ([place])   05/23/2015 Initial Diagnosis    Right breast biopsy 8:00 position: Invasive ductal carcinoma with DCIS, grade 1, ER 100%, PR 100%, Ki-67 10%, HER-2 negative ratio 1.36, mammogram revealed a 10 mm mass 8 cm from the nipple, T1b N0 stage IA clinical stage      06/20/2015 Surgery    Right lumpectomy [name]): IDC with DCIS, 1 cm, invasive cancer focally at anterior margin, 0/4 lymph nodes negative, T1 BN 0 stage IA; resection of the anterior margin is negative for cancer, Oncotype DX score 7, 5% ROR      06/20/2015 Oncotype testing    Recurrence score: 7; 5% ROR (low-risk)      06/29/2015 Surgery    Right breast re-excision [name]): No tumor.       07/26/2015 - 08/22/2015 Radiation Therapy    Adjuvant breast radiation [name]). The Right breast was treated to 42.5 Gy in 17 fractions at 2.5 Gy per fraction.  The Right breast was boosted to  7.5 Gy in 3 fractions at 2.5 Gy per fraction.       08/2015 -  Anti-estrogen oral therapy    Anastrozole 1 mg daily. Planned duration of therapy: 5 years.        INTERVAL HISTORY:  Ms. Riddle presents to the Survivorship Clinic today for our initial meeting to review her survivorship care plan detailing her treatment course for breast cancer, as well as monitoring long-term side effects of that treatment, education regarding health maintenance, screening, and overall wellness and health promotion.     Overall, Ms. Pedraza reports feeling quite well since completing her radiation therapy approximately 3 months ago.  She started her anastrozole in 08/2015 and is tolerating it very well.  She occasionally has "warm spells", but she does not consider these to be hot flashes per se.  She continues to have some fatigue, but she is working through  that by going to the gym with her girlfriends 4x/week. Her appetite is good.    She continues to be seen at [facility] for injections to her right eye for central retinal vein occlusion. She has not had her annual flu vaccine and would like to know if she can get it here today.      REVIEW OF SYSTEMS:  Review of Systems  Constitutional: Positive for malaise/fatigue.  HENT: Negative.   Respiratory: Negative.   Cardiovascular: Negative.   Gastrointestinal: Negative.   Genitourinary: Negative.   Musculoskeletal: Negative.   Skin: Negative.   Neurological: Negative.   Endo/Heme/Allergies: Negative.   Psychiatric/Behavioral: Negative.   GU: Denies vaginal bleeding, discharge, or dryness.  Breast: Denies any new nodularity, masses, tenderness, nipple changes, or nipple discharge.    A 14-point review of systems was completed and was negative, except as noted above.   ONCOLOGY TREATMENT TEAM:  1. Surgeon:  Dr. [name] at [facility] 2. Medical Oncologist: Dr. Lindi Adie 3. Radiation Oncologist: Dr. [name]    PAST MEDICAL/SURGICAL HISTORY:  Past Medical History:  Diagnosis Date  . Anxiety   . Glaucoma    WITH CENTRAL RETINAL VEIN OCCLUSION  . Hyperlipidemia    HYPERCHOLESTEROLEMIA  . Hypertension   . Hypothyroidism   . Thyroid disease    Past Surgical History:  Procedure Laterality Date  . CATARACT EXTRACTION    .  COLONOSCOPY    . eye injection Right   . RADIOACTIVE SEED GUIDED MASTECTOMY WITH AXILLARY SENTINEL LYMPH NODE BIOPSY Right 06/20/2015   Procedure: RIGHT BREAST RADIOACTIVE SEED GUIDED PARTIAL MASTECTOMY WITH BLUE DYE INJECTION OF RIGHT BREAST AND RIGHT AXILLARY SENTINEL LYMPH NODE BIOPSY;  Surgeon: Fanny Skates, MD;  Location: [facility];  Service: General;  Laterality: Right;  . RE-EXCISION OF BREAST CANCER,SUPERIOR MARGINS Right 06/29/2015   Procedure: RE-EXCISION OF BREAST LUMPECTOMY MARGINS;  Surgeon: Fanny Skates, MD;  Location: [facility];  Service: General;  Laterality: Right;  . REFRACTIVE SURGERY    . TUBE FOR GLAUCOMA     TO REDUCE PRESSURE     ALLERGIES:  Allergies  Allergen Reactions  . Brimonidine Other (See Comments)    Redness in eye  . Dorzolamide Other (See Comments)    EYE REDNESS  . Dorzolamide Hcl-Timolol Mal Pf Other (See Comments)    Redness     CURRENT MEDICATIONS:  Outpatient Encounter Prescriptions as of 11/21/2015  Medication Sig  . acetaminophen (TYLENOL) 500 MG tablet Take 500 mg by mouth as needed for mild pain.  Marland Kitchen anastrozole (ARIMIDEX) 1 MG tablet Take 1 tablet (1 mg total) by mouth daily.  Marland Kitchen atorvastatin (LIPITOR) 20 MG tablet Take 20 mg by mouth daily.  . Calcium Carb-Cholecalciferol (CALCIUM + D3) 600-200 MG-UNIT TABS Take 1 tablet by mouth 2 (two) times daily.  . clonazePAM (KLONOPIN) 1 MG tablet Take 0.5-1 mg by mouth 2 (two) times daily as needed for anxiety.   . hydrochlorothiazide (HYDRODIURIL) 25 MG tablet Take 25 mg by mouth daily.  Marland Kitchen ibuprofen (ADVIL,MOTRIN) 200 MG tablet Take 200 mg by mouth every 6 (six) hours as needed.  . latanoprost (XALATAN) 0.005 % ophthalmic solution Place 1 drop into the left eye at bedtime.   Marland Kitchen levothyroxine (SYNTHROID, LEVOTHROID) 150 MCG tablet Take 150 mcg by mouth daily before breakfast.  . non-metallic deodorant (ALRA) MISC Apply 1 application topically daily as needed.  . Omega-3 Fatty Acids (FISH OIL) 1000 MG CAPS Take 2,000 mg by mouth 2 (two) times daily.   Marland Kitchen OVER THE COUNTER MEDICATION Take 2,000 mg by mouth daily. Cinnamon '1000mg'$   . potassium chloride (K-DUR,KLOR-CON) 10 MEQ tablet Take 20 mEq by mouth daily.   Marland Kitchen telmisartan (MICARDIS) 40 MG tablet Take 40 mg by mouth daily.  . timolol (TIMOPTIC) 0.5 % ophthalmic solution Place 1 drop into both eyes 2 (two) times daily.   . [EXPIRED] Influenza vac split quadrivalent PF (FLUARIX) injection 0.5 mL    No facility-administered encounter medications on file as of 11/21/2015.       ONCOLOGIC FAMILY HISTORY:  Family History  Problem Relation Age of Onset  . Diabetes Mellitus I Mother   . Hyperlipidemia Mother   . Heart failure Mother   . Hypertension Father   . Heart failure Father   . Diabetes Mellitus I Father   . Hypertension Sister   . Diabetes Mellitus I Sister   . Hypertension Sister   . Dementia Sister   . Brain cancer Brother      GENETIC COUNSELING/TESTING: None.   SOCIAL HISTORY:  JAENA BROCATO is widowed and lives in [place], [place].  She has 1 daughter. She is a retired Education officer, museum.  She denies any current tobacco, alcohol, or illicit drug use.     PHYSICAL EXAMINATION:  Vital Signs:   Vitals:   11/21/15 0836  BP: 131/80  Pulse: (!) 103  Resp: 19  Temp:  97.7 F (36.5 C)   Filed Weights   11/21/15 0836  Weight: 193 lb 9.6 oz (87.8 kg)   General: Well-nourished, well-appearing female in no acute distress.  She is unaccompanied today.  HEENT: Head is normocephalic. Conjunctivae clear.  Sclerae anicteric, but erythematous (s/p injections). Oral mucosa is pink, moist.  Oropharynx is pink without lesions or erythema.  Lymph: No cervical, supraclavicular, or infraclavicular lymphadenopathy noted on palpation.  Cardiovascular: Regular rate and rhythm.Marland Kitchen Respiratory: Clear to auscultation bilaterally. Chest expansion symmetric; breathing non-labored.  GI: Abdomen soft and round; non-tender, non-distended. Bowel sounds normoactive.  GU: Deferred.  Neuro: No focal deficits. Steady gait.  Psych: Mood and affect normal and appropriate for situation.  Extremities: No edema. Skin: Warm and dry.  LABORATORY DATA:  None for this visit.  DIAGNOSTIC IMAGING:  None for this visit.      ASSESSMENT AND PLAN:  Ms.. Ciesla is a pleasant 65 y.o. female with Stage IA right breast invasive ductal carcinoma, ER+/PR+/HER2-, diagnosed in 04/2015, treated with lumpectomy, adjuvant radiation therapy, and anti-estrogen therapy with Anastrozole  beginning in 08/2015.  She presents to the Survivorship Clinic for our initial meeting and routine follow-up post-completion of treatment for breast cancer.    1. Stage IA right/left breast cancer:  Ms. Teegarden is continuing to recover from definitive treatment for breast cancer. She will follow-up with her medical oncologist, Dr. Lindi Adie, in 12/2015 with history and physical exam per surveillance protocol.  She will continue her anti-estrogen therapy with Anastrozole. Thus far, she is tolerating the medication very well, with minimal side effects. She was instructed to make Dr. Lindi Adie or myself aware if she begins to experience any worsening side effects of the medication and I could see her back in clinic to help manage those side effects, as needed.  Common side effects of Anastrozole were again reviewed with her as well. Today, a comprehensive survivorship care plan and treatment summary was reviewed with the patient today detailing her breast cancer diagnosis, treatment course, potential late/long-term effects of treatment, appropriate follow-up care with recommendations for the future, and patient education resources.  A copy of this summary, along with a letter will be sent to the patient's primary care provider via mail/fax/In Basket message after today's visit.    2. Fatigue: Fatigue is one of the most common complaints among cancer survivors.  I commended her efforts in physical activity and encouraged her to keep up the great work.  Data suggests that exercise of one of the best ways to help combat treatment-related fatigue.  I also encouraged her to continue to monitor her symptoms and if the fatigue does not improve, then it could be indicative of thyroid dysfunction (she does have h/o hypothyroidism), anemia, or some other cause.  She has frequent follow-up visits with the PCP, which I recommended she maintain and alert Dr. Radene Ou to any new/worsening symptoms as it relates to her fatigue.    3.  Flu vaccine: Annual flu vaccine administered today at cancer center.    4. Bone health:  Given Ms. Eynon's age, history of breast cancer, and her current treatment regimen including anti-estrogen therapy with anastrozole, she is at risk for bone demineralization.  Her last DEXA scan was 05/03/15, which showed osteopenia.  She will be due for follow-up scan in 2 years, as clinically indicated.  In the meantime, she was encouraged to increase her consumption of foods rich in calcium, as well as increase her weight-bearing activities.  She also takes calcium and  vitamin D supplements, which I recommended she continue. She was given education on specific activities to promote bone health.  5. Cancer screening:  Due to Ms. Brabant's history and her age, she should receive screening for skin cancers, colon cancer, and gynecologic cancers.  The information and recommendations are listed on the patient's comprehensive care plan/treatment summary and were reviewed in detail with the patient.    6. Health maintenance and wellness promotion: Ms. Seiler was encouraged to consume 5-7 servings of fruits and vegetables per day. We reviewed the "Nutrition Rainbow" handout.  She was also encouraged to engage in moderate to vigorous exercise for 30 minutes per day most days of the week. We discussed the [employer] fitness program, which is designed for cancer survivors to help them become more physically fit after cancer treatments.  She was instructed to limit her alcohol consumption and continue to abstain from tobacco use.     7. Support services/counseling: It is not uncommon for this period of the patient's cancer care trajectory to be one of many emotions and stressors.  We discussed an opportunity for her to participate in the next session of [facility] ("Finding Your New Normal") support group series designed for patients after they have completed treatment.   Ms. Bernardi was encouraged to take advantage of our many  other support services programs, support groups, and/or counseling in coping with her new life as a cancer survivor after completing anti-cancer treatment.  She was offered support today through active listening and expressive supportive counseling.  She was given information regarding our available services and encouraged to contact me with any questions or for help enrolling in any of our support group/programs.    Dispo:   -Return to cancer center to see Dr. Lindi Adie in 12/2015.  -She is welcome to return back to the Survivorship Clinic at any time; no additional follow-up needed at this time.  -Consider referral back to survivorship as a long-term survivor for continued surveillance  A total of 45 minutes of face-to-face time was spent with this patient with greater than 50% of that time in counseling and care-coordination.   Mike Craze, NP Survivorship Program [facility] [phone]   Note: PRIMARY CARE PROVIDER Aretta Nip, [place] [phone]

## 2016-01-02 ENCOUNTER — Ambulatory Visit: Payer: Medicare Other | Admitting: Hematology and Oncology

## 2016-01-25 NOTE — Assessment & Plan Note (Signed)
Right breast biopsy 8:00 position 05/23/2015: Invasive ductal carcinoma with DCIS, grade 1, ER 100%, PR 100%, Ki-67 10%, HER-2 negative ratio 1.36, mammogram revealed a 10 mm mass 8 cm from the nipple, T1b N0 stage IA clinical stage Right lumpectomy: IDC with DCIS, 1 cm, invasive cancer focally at anterior margin, 0/4 lymph nodes negative, T1 BN 0 stage IA; 3 resection of the anterior margin is negative for cancer, Oncotype DX score 7, 5% ROR Adj XRT 5/30- 08/26/15  Current Treatment: Anastrozole started Aug 2017  Anastrozole Toxicities:   RTC 6 months

## 2016-01-26 ENCOUNTER — Encounter: Payer: Self-pay | Admitting: Hematology and Oncology

## 2016-01-26 ENCOUNTER — Ambulatory Visit (HOSPITAL_BASED_OUTPATIENT_CLINIC_OR_DEPARTMENT_OTHER): Payer: Medicare Other | Admitting: Hematology and Oncology

## 2016-01-26 DIAGNOSIS — Z79811 Long term (current) use of aromatase inhibitors: Secondary | ICD-10-CM

## 2016-01-26 DIAGNOSIS — C50511 Malignant neoplasm of lower-outer quadrant of right female breast: Secondary | ICD-10-CM | POA: Diagnosis not present

## 2016-01-26 DIAGNOSIS — R5383 Other fatigue: Secondary | ICD-10-CM | POA: Diagnosis not present

## 2016-01-26 DIAGNOSIS — Z170 Estrogen receptor positive status [ER+]: Principal | ICD-10-CM

## 2016-01-26 DIAGNOSIS — N951 Menopausal and female climacteric states: Secondary | ICD-10-CM

## 2016-01-26 NOTE — Progress Notes (Signed)
Patient Care Team: Aretta Nip, MD as PCP - General (Family Medicine)  DIAGNOSIS:  Encounter Diagnosis  Name Primary?  . Malignant neoplasm of lower-outer quadrant of right breast of female, estrogen receptor positive ([place])     SUMMARY OF ONCOLOGIC HISTORY:   Breast cancer of lower-outer quadrant of right female breast ([place])   05/23/2015 Initial Diagnosis    Right breast biopsy 8:00 position: Invasive ductal carcinoma with DCIS, grade 1, ER 100%, PR 100%, Ki-67 10%, HER-2 negative ratio 1.36, mammogram revealed a 10 mm mass 8 cm from the nipple, T1b N0 stage IA clinical stage      06/20/2015 Surgery    Right lumpectomy [name]): IDC with DCIS, 1 cm, invasive cancer focally at anterior margin, 0/4 lymph nodes negative, T1 BN 0 stage IA; resection of the anterior margin is negative for cancer, Oncotype DX score 7, 5% ROR      06/20/2015 Oncotype testing    Recurrence score: 7; 5% ROR (low-risk)      06/29/2015 Surgery    Right breast re-excision [name]): No tumor.       07/26/2015 - 08/22/2015 Radiation Therapy    Adjuvant breast radiation [name]). The Right breast was treated to 42.5 Gy in 17 fractions at 2.5 Gy per fraction.  The Right breast was boosted to  7.5 Gy in 3 fractions at 2.5 Gy per fraction.       08/2015 -  Anti-estrogen oral therapy    Anastrozole 1 mg daily. Planned duration of therapy: 5 years.        CHIEF COMPLIANT: Follow-up on anastrozole  INTERVAL HISTORY: Tiffany Thompson is a 65 year old with above-mentioned history of right breast cancer treated with lumpectomy, adjuvant radiation and is now on anastrozole since July 2017. She is tolerating anastrozole fairly well. She does have occasional hot flashes myalgias. Denies any lumps or nodules in the breasts.  REVIEW OF SYSTEMS:   Constitutional: Denies fevers, chills or abnormal weight loss Eyes: Denies blurriness of vision Ears, nose, mouth, throat, and face: Denies mucositis or sore  throat Respiratory: Denies cough, dyspnea or wheezes Cardiovascular: Denies palpitation, chest discomfort Gastrointestinal:  Denies nausea, heartburn or change in bowel habits Skin: Denies abnormal skin rashes Lymphatics: Denies new lymphadenopathy or easy bruising Neurological:Denies numbness, tingling or new weaknesses Behavioral/Psych: Mood is stable, no new changes  Extremities: No lower extremity edema Breast:  denies any pain or lumps or nodules in either breasts All other systems were reviewed with the patient and are negative.  I have reviewed the past medical history, past surgical history, social history and family history with the patient and they are unchanged from previous note.  ALLERGIES:  is allergic to brimonidine; dorzolamide; and dorzolamide hcl-timolol mal pf.  MEDICATIONS:  Current Outpatient Prescriptions  Medication Sig Dispense Refill  . acetaminophen (TYLENOL) 500 MG tablet Take 500 mg by mouth as needed for mild pain.    Marland Kitchen anastrozole (ARIMIDEX) 1 MG tablet Take 1 tablet (1 mg total) by mouth daily. 90 tablet 3  . atorvastatin (LIPITOR) 20 MG tablet Take 20 mg by mouth daily.    . Calcium Carb-Cholecalciferol (CALCIUM + D3) 600-200 MG-UNIT TABS Take 1 tablet by mouth 2 (two) times daily.    . clonazePAM (KLONOPIN) 1 MG tablet Take 0.5-1 mg by mouth 2 (two) times daily as needed for anxiety.     . hydrochlorothiazide (HYDRODIURIL) 25 MG tablet Take 25 mg by mouth daily.    Marland Kitchen ibuprofen (ADVIL,MOTRIN) 200 MG tablet  Take 200 mg by mouth every 6 (six) hours as needed.    . latanoprost (XALATAN) 0.005 % ophthalmic solution Place 1 drop into the left eye at bedtime.     Marland Kitchen levothyroxine (SYNTHROID, LEVOTHROID) 150 MCG tablet Take 150 mcg by mouth daily before breakfast.  0  . non-metallic deodorant (ALRA) MISC Apply 1 application topically daily as needed.    . Omega-3 Fatty Acids (FISH OIL) 1000 MG CAPS Take 2,000 mg by mouth 2 (two) times daily.     Marland Kitchen OVER THE  COUNTER MEDICATION Take 2,000 mg by mouth daily. Cinnamon 1073m    . potassium chloride (K-DUR,KLOR-CON) 10 MEQ tablet Take 20 mEq by mouth daily.   0  . telmisartan (MICARDIS) 40 MG tablet Take 40 mg by mouth daily.    . timolol (TIMOPTIC) 0.5 % ophthalmic solution Place 1 drop into both eyes 2 (two) times daily.      No current facility-administered medications for this visit.     PHYSICAL EXAMINATION: ECOG PERFORMANCE STATUS: 1 - Symptomatic but completely ambulatory  Vitals:   01/26/16 1013  BP: 133/89  Pulse: (!) 111  Resp: 18  Temp: 97.6 F (36.4 C)   Filed Weights   01/26/16 1013  Weight: 193 lb 4.8 oz (87.7 kg)    GENERAL:alert, no distress and comfortable SKIN: skin color, texture, turgor are normal, no rashes or significant lesions EYES: normal, Conjunctiva are pink and non-injected, sclera clear OROPHARYNX:no exudate, no erythema and lips, buccal mucosa, and tongue normal  NECK: supple, thyroid normal size, non-tender, without nodularity LYMPH:  no palpable lymphadenopathy in the cervical, axillary or inguinal LUNGS: clear to auscultation and percussion with normal breathing effort HEART: regular rate & rhythm and no murmurs and no lower extremity edema ABDOMEN:abdomen soft, non-tender and normal bowel sounds MUSCULOSKELETAL:no cyanosis of digits and no clubbing  NEURO: alert & oriented x 3 with fluent speech, no focal motor/sensory deficits EXTREMITIES: No lower extremity edema  LABORATORY DATA:  I have reviewed the data as listed   Chemistry      Component Value Date/Time   NA 136 06/13/2015 1230   NA 139 06/01/2015 0848   K 3.8 06/13/2015 1230   K 3.3 (L) 06/01/2015 0848   CL 96 (L) 06/13/2015 1230   CO2 28 06/13/2015 1230   CO2 30 (H) 06/01/2015 0848   BUN 17 06/13/2015 1230   BUN 28.2 (H) 06/01/2015 0848   CREATININE 1.00 06/13/2015 1230   CREATININE 1.2 (H) 06/01/2015 0848      Component Value Date/Time   CALCIUM 10.0 06/13/2015 1230   CALCIUM  10.1 06/01/2015 0848   ALKPHOS 141 06/01/2015 0848   AST 15 06/01/2015 0848   ALT 21 06/01/2015 0848   BILITOT 0.48 06/01/2015 0848       Lab Results  Component Value Date   WBC 7.6 06/01/2015   HGB 14.1 06/01/2015   HCT 40.7 06/01/2015   MCV 87.7 06/01/2015   PLT 280 06/01/2015   NEUTROABS 5.5 06/01/2015    ASSESSMENT & PLAN:  Breast cancer of lower-outer quadrant of right [place] breast [place] Right breast biopsy 8:00 position 05/23/2015: Invasive ductal carcinoma with DCIS, grade 1, ER 100%, PR 100%, Ki-67 10%, HER-2 negative ratio 1.36, mammogram revealed a 10 mm mass 8 cm from the nipple, T1b N0 stage IA clinical stage Right lumpectomy: IDC with DCIS, 1 cm, invasive cancer focally at anterior margin, 0/4 lymph nodes negative, T1 BN 0 stage IA; 3 resection of the anterior  margin is negative for cancer, Oncotype DX score 7, 5% ROR Adj XRT 5/30- 08/26/15  Current Treatment: Anastrozole started Aug 2017  Anastrozole Toxicities: 1. Mild hot flashes 2. mild fatigue But patient has been exercising 3-4 times a week. She enjoys spending time with her grandchild in [place]. She is looking forward to spending Christmas with her daughter. Patient denies any myalgias or arthralgias  RTC 6 months    Orders Placed This Encounter  Procedures  . MM DIAG BREAST TOMO BILATERAL    Standing Status:   Future    Standing Expiration Date:   03/27/2017    Order Specific Question:   Reason for Exam (SYMPTOM  OR DIAGNOSIS REQUIRED)    Answer:   Breast cancer Right breast annual diagnsotic exam    Order Specific Question:   Preferred imaging location?    Answer:   [facility]   The patient has a good understanding of the overall plan. she agrees with it. she will call with any problems that may develop before the next visit here.   Rulon Eisenmenger, MD 01/26/16

## 2016-05-07 ENCOUNTER — Ambulatory Visit
Admission: RE | Admit: 2016-05-07 | Discharge: 2016-05-07 | Disposition: A | Discharge status: Home / Self Care | Payer: Medicare Other | Source: Ambulatory Visit | Attending: Hematology and Oncology | Admitting: Hematology and Oncology

## 2016-05-07 DIAGNOSIS — C50511 Malignant neoplasm of lower-outer quadrant of right female breast: Secondary | ICD-10-CM

## 2016-05-07 DIAGNOSIS — Z170 Estrogen receptor positive status [ER+]: Principal | ICD-10-CM

## 2016-07-16 ENCOUNTER — Encounter: Payer: Self-pay | Admitting: Hematology and Oncology

## 2016-07-16 ENCOUNTER — Ambulatory Visit (HOSPITAL_BASED_OUTPATIENT_CLINIC_OR_DEPARTMENT_OTHER): Payer: Medicare Other | Admitting: Hematology and Oncology

## 2016-07-16 DIAGNOSIS — C50511 Malignant neoplasm of lower-outer quadrant of right female breast: Secondary | ICD-10-CM

## 2016-07-16 DIAGNOSIS — R5383 Other fatigue: Secondary | ICD-10-CM

## 2016-07-16 DIAGNOSIS — Z170 Estrogen receptor positive status [ER+]: Secondary | ICD-10-CM | POA: Diagnosis not present

## 2016-07-16 DIAGNOSIS — N951 Menopausal and female climacteric states: Secondary | ICD-10-CM

## 2016-07-16 MED ORDER — TIMOLOL HEMIHYDRATE 0.5 % OP SOLN: 1.0000 [drp] | Freq: Two times a day (BID) | OPHTHALMIC | 12 refills | Status: AC

## 2016-07-16 MED ORDER — ANASTROZOLE 1 MG PO TABS: 1.0000 mg | ORAL_TABLET | Freq: Every day | ORAL | 3 refills | Status: DC

## 2016-07-16 NOTE — Progress Notes (Signed)
Patient Care Team: Rankins, Bill Salinas, MD as PCP - General (Family Medicine)  DIAGNOSIS:  Encounter Diagnosis  Name Primary?  . Malignant neoplasm of lower-outer quadrant of right breast of female, estrogen receptor positive ([name])     SUMMARY OF ONCOLOGIC HISTORY:   Breast cancer of lower-outer quadrant of right female breast ([name])   05/23/2015 Initial Diagnosis    Right breast biopsy 8:00 position: Invasive ductal carcinoma with DCIS, grade 1, ER 100%, PR 100%, Ki-67 10%, HER-2 negative ratio 1.36, mammogram revealed a 10 mm mass 8 cm from the nipple, T1b N0 stage IA clinical stage      06/20/2015 Surgery    Right lumpectomy [name]): IDC with DCIS, 1 cm, invasive cancer focally at anterior margin, 0/4 lymph nodes negative, T1 BN 0 stage IA; resection of the anterior margin is negative for cancer, Oncotype DX score 7, 5% ROR      06/20/2015 Oncotype testing    Recurrence score: 7; 5% ROR (low-risk)      06/29/2015 Surgery    Right breast re-excision [name]): No tumor.       07/26/2015 - 08/22/2015 Radiation Therapy    Adjuvant breast radiation [name]). The Right breast was treated to 42.5 Gy in 17 fractions at 2.5 Gy per fraction.  The Right breast was boosted to  7.5 Gy in 3 fractions at 2.5 Gy per fraction.       08/2015 -  Anti-estrogen oral therapy    Anastrozole 1 mg daily. Planned duration of therapy: 5 years.       CHIEF COMPLIANT: Follow-up on anastrozole therapy  INTERVAL HISTORY: Tiffany Thompson is a 66 year old with above-mentioned history of right breast cancer currently on adjuvant anastrozole therapy. She is tolerating anastrozole extremely well. She denies any hot flashes or myalgias. She denies any lumps or nodules in breast. She has breast exam yesterday from Dr. [name] which was benign.  REVIEW OF SYSTEMS:   Constitutional: Denies fevers, chills or abnormal weight loss Eyes: Denies blurriness of vision Ears, nose, mouth, throat, and face: Denies  mucositis or sore throat Respiratory: Denies cough, dyspnea or wheezes Cardiovascular: Denies palpitation, chest discomfort Gastrointestinal:  Denies nausea, heartburn or change in bowel habits Skin: Denies abnormal skin rashes Lymphatics: Denies new lymphadenopathy or easy bruising Neurological:Denies numbness, tingling or new weaknesses Behavioral/Psych: Mood is stable, no new changes  Extremities: No lower extremity edema Breast:  denies any pain or lumps or nodules in either breasts All other systems were reviewed with the patient and are negative.  I have reviewed the past medical history, past surgical history, social history and family history with the patient and they are unchanged from previous note.  ALLERGIES:  is allergic to brimonidine; dorzolamide; and dorzolamide hcl-timolol mal pf.  MEDICATIONS:  Current Outpatient Prescriptions  Medication Sig Dispense Refill  . acetaminophen (TYLENOL) 500 MG tablet Take 500 mg by mouth as needed for mild pain.    Marland Kitchen anastrozole (ARIMIDEX) 1 MG tablet Take 1 tablet (1 mg total) by mouth daily. 90 tablet 3  . atorvastatin (LIPITOR) 20 MG tablet Take 20 mg by mouth daily.    . Calcium Carb-Cholecalciferol (CALCIUM + D3) 600-200 MG-UNIT TABS Take 1 tablet by mouth 2 (two) times daily.    . clonazePAM (KLONOPIN) 1 MG tablet Take 0.5-1 mg by mouth 2 (two) times daily as needed for anxiety.     . hydrochlorothiazide (HYDRODIURIL) 25 MG tablet Take 25 mg by mouth daily.    Marland Kitchen ibuprofen (ADVIL,MOTRIN)  200 MG tablet Take 200 mg by mouth every 6 (six) hours as needed.    . latanoprost (XALATAN) 0.005 % ophthalmic solution Place 1 drop into the left eye at bedtime.     Marland Kitchen levothyroxine (SYNTHROID, LEVOTHROID) 150 MCG tablet Take 150 mcg by mouth daily before breakfast.  0  . non-metallic deodorant (ALRA) MISC Apply 1 application topically daily as needed.    . Omega-3 Fatty Acids (FISH OIL) 1000 MG CAPS Take 2,000 mg by mouth 2 (two) times daily.       Marland Kitchen OVER THE COUNTER MEDICATION Take 2,000 mg by mouth daily. Cinnamon '1000mg'$     . potassium chloride (K-DUR,KLOR-CON) 10 MEQ tablet Take 20 mEq by mouth daily.   0  . telmisartan (MICARDIS) 40 MG tablet Take 40 mg by mouth daily.     No current facility-administered medications for this visit.     PHYSICAL EXAMINATION: ECOG PERFORMANCE STATUS: 1 - Symptomatic but completely ambulatory  Vitals:   07/16/16 1024  BP: 126/87  Pulse: 86  Resp: 18  Temp: 98 F (36.7 C)   Filed Weights   07/16/16 1024  Weight: 188 lb 14.4 oz (85.7 kg)    GENERAL:alert, no distress and comfortable SKIN: skin color, texture, turgor are normal, no rashes or significant lesions EYES: normal, Conjunctiva are pink and non-injected, sclera clear OROPHARYNX:no exudate, no erythema and lips, buccal mucosa, and tongue normal  NECK: supple, thyroid normal size, non-tender, without nodularity LYMPH:  no palpable lymphadenopathy in the cervical, axillary or inguinal LUNGS: clear to auscultation and percussion with normal breathing effort HEART: regular rate & rhythm and no murmurs and no lower extremity edema ABDOMEN:abdomen soft, non-tender and normal bowel sounds MUSCULOSKELETAL:no cyanosis of digits and no clubbing  NEURO: alert & oriented x 3 with fluent speech, no focal motor/sensory deficits EXTREMITIES: No lower extremity edema  LABORATORY DATA:  I have reviewed the data as listed   Chemistry      Component Value Date/Time   NA 136 06/13/2015 1230   NA 139 06/01/2015 0848   K 3.8 06/13/2015 1230   K 3.3 (L) 06/01/2015 0848   CL 96 (L) 06/13/2015 1230   CO2 28 06/13/2015 1230   CO2 30 (H) 06/01/2015 0848   BUN 17 06/13/2015 1230   BUN 28.2 (H) 06/01/2015 0848   CREATININE 1.00 06/13/2015 1230   CREATININE 1.2 (H) 06/01/2015 0848      Component Value Date/Time   CALCIUM 10.0 06/13/2015 1230   CALCIUM 10.1 06/01/2015 0848   ALKPHOS 141 06/01/2015 0848   AST 15 06/01/2015 0848   ALT 21  06/01/2015 0848   BILITOT 0.48 06/01/2015 0848       Lab Results  Component Value Date   WBC 7.6 06/01/2015   HGB 14.1 06/01/2015   HCT 40.7 06/01/2015   MCV 87.7 06/01/2015   PLT 280 06/01/2015   NEUTROABS 5.5 06/01/2015    ASSESSMENT & PLAN:  Breast cancer of lower-outer quadrant of right female breast ([employer]) Right breast biopsy 8:00 position 05/23/2015: Invasive ductal carcinoma with DCIS, grade 1, ER 100%, PR 100%, Ki-67 10%, HER-2 negative ratio 1.36, mammogram revealed a 10 mm mass 8 cm from the nipple, T1b N0 stage IA clinical stage Right lumpectomy: IDC with DCIS, 1 cm, invasive cancer focally at anterior margin, 0/4 lymph nodes negative, T1 BN 0 stage IA; 3 resection of the anterior margin is negative for cancer, Oncotype DX score 7, 5% ROR Adj XRT 5/30- 08/26/15  Current Treatment:  Anastrozole started Aug 2017  Anastrozole Toxicities: 1. Mild hot flashes 2. mild fatigue  Survivorship: But patient has been exercising 3-4 times a week. She enjoys spending time with her grandchild in [place]. Patient wishes to follow up with [place] primarily for breast cancer surveillance. She also gets breast exams through her gynecologist. RTC 1 yr  I spent 25 minutes talking to the patient of which more than half was spent in counseling and coordination of care.  No orders of the defined types were placed in this encounter.  The patient has a good understanding of the overall plan. she agrees with it. she will call with any problems that may develop before the next visit here.   Rulon Eisenmenger, MD 07/16/16

## 2016-07-16 NOTE — Assessment & Plan Note (Signed)
Right breast biopsy 8:00 position 05/23/2015: Invasive ductal carcinoma with DCIS, grade 1, ER 100%, PR 100%, Ki-67 10%, HER-2 negative ratio 1.36, mammogram revealed a 10 mm mass 8 cm from the nipple, T1b N0 stage IA clinical stage Right lumpectomy: IDC with DCIS, 1 cm, invasive cancer focally at anterior margin, 0/4 lymph nodes negative, T1 BN 0 stage IA; 3 resection of the anterior margin is negative for cancer, Oncotype DX score 7, 5% ROR Adj XRT 5/30- 08/26/15  Current Treatment: Anastrozole started Aug 2017  Anastrozole Toxicities: 1. Mild hot flashes 2. mild fatigue  Survivorship: But patient has been exercising 3-4 times a week. She enjoys spending time with her grandchild in [place]. Patient denies any myalgias or arthralgias  RTC 1 yr

## 2017-04-02 ENCOUNTER — Other Ambulatory Visit: Payer: Self-pay | Admitting: Hematology and Oncology

## 2017-04-02 DIAGNOSIS — Z9889 Other specified postprocedural states: Secondary | ICD-10-CM

## 2017-05-08 ENCOUNTER — Ambulatory Visit
Admission: RE | Admit: 2017-05-08 | Discharge: 2017-05-08 | Disposition: A | Discharge status: Home / Self Care | Payer: Medicare Other | Source: Ambulatory Visit | Attending: Hematology and Oncology | Admitting: Hematology and Oncology

## 2017-05-08 DIAGNOSIS — Z9889 Other specified postprocedural states: Secondary | ICD-10-CM

## 2017-05-08 HISTORY — DX: Personal history of irradiation: Z92.3

## 2017-06-25 ENCOUNTER — Telehealth: Payer: Self-pay | Admitting: Hematology and Oncology

## 2017-06-25 NOTE — Telephone Encounter (Signed)
callled pt re appts that were moved due to pal - left vm for pt re appts.

## 2017-07-15 ENCOUNTER — Ambulatory Visit: Payer: Medicare Other | Admitting: Hematology and Oncology

## 2017-07-16 ENCOUNTER — Inpatient Hospital Stay: Payer: Medicare Other | Attending: Hematology and Oncology | Admitting: Hematology and Oncology

## 2017-07-16 ENCOUNTER — Telehealth: Payer: Self-pay | Admitting: Hematology and Oncology

## 2017-07-16 DIAGNOSIS — Z79811 Long term (current) use of aromatase inhibitors: Secondary | ICD-10-CM | POA: Insufficient documentation

## 2017-07-16 DIAGNOSIS — Z170 Estrogen receptor positive status [ER+]: Secondary | ICD-10-CM | POA: Diagnosis not present

## 2017-07-16 DIAGNOSIS — C50511 Malignant neoplasm of lower-outer quadrant of right female breast: Secondary | ICD-10-CM | POA: Insufficient documentation

## 2017-07-16 MED ORDER — ANASTROZOLE 1 MG PO TABS: 1.0000 mg | ORAL_TABLET | Freq: Every day | ORAL | 3 refills | Status: DC

## 2017-07-16 NOTE — Telephone Encounter (Signed)
Gave patient AVs and calendar of upcoming may 2020 appointments °

## 2017-07-16 NOTE — Progress Notes (Signed)
Patient Care Team: Rankins, Tiffany Salinas, MD as PCP - General (Family Medicine)  DIAGNOSIS:  Encounter Diagnosis  Name Primary?  . Malignant neoplasm of lower-outer quadrant of right breast of female, estrogen receptor positive ([place])     SUMMARY OF ONCOLOGIC HISTORY:   Breast cancer of lower-outer quadrant of right female breast (Tiffany Thompson)   05/23/2015 Initial Diagnosis    Right breast biopsy 8:00 position: Invasive ductal carcinoma with DCIS, grade 1, ER 100%, PR 100%, Ki-67 10%, HER-2 negative ratio 1.36, mammogram revealed a 10 mm mass 8 cm from the nipple, T1b N0 stage IA clinical stage      06/20/2015 Surgery    Right lumpectomy Tiffany Thompson): IDC with DCIS, 1 cm, invasive cancer focally at anterior margin, 0/4 lymph nodes negative, T1 BN 0 stage IA; resection of the anterior margin is negative for cancer, Oncotype DX score 7, 5% ROR      06/20/2015 Oncotype testing    Recurrence score: 7; 5% ROR (low-risk)      06/29/2015 Surgery    Right breast re-excision Tiffany Thompson): No tumor.       07/26/2015 - 08/22/2015 Radiation Therapy    Adjuvant breast radiation Tiffany Thompson). The Right breast was treated to 42.5 Gy in 17 fractions at 2.5 Gy per fraction.  The Right breast was boosted to  7.5 Gy in 3 fractions at 2.5 Gy per fraction.       08/2015 -  Anti-estrogen oral therapy    Anastrozole 1 mg daily. Planned duration of therapy: 5 years.        CHIEF COMPLIANT: Follow-up on anastrozole therapy  INTERVAL HISTORY: Tiffany Thompson is a 67 year old with above-mentioned history of right breast cancer treated with lumpectomy radiation and is currently on adjuvant antiestrogen therapy with anastrozole.  She is tolerating anastrozole extremely well.  She denies any lumps or nodules in the breast.  She denies any hot flashes or arthralgias or myalgias.  She is exercising regularly through the Silver sneakers program at [employer].  REVIEW OF SYSTEMS:   Constitutional: Denies fevers, chills or  abnormal weight loss Eyes: Denies blurriness of vision Ears, nose, mouth, throat, and face: Denies mucositis or sore throat Respiratory: Denies cough, dyspnea or wheezes Cardiovascular: Denies palpitation, chest discomfort Gastrointestinal:  Denies nausea, heartburn or change in bowel habits Skin: Denies abnormal skin rashes Lymphatics: Denies new lymphadenopathy or easy bruising Neurological:Denies numbness, tingling or new weaknesses Behavioral/Psych: Mood is stable, no new changes  Extremities: No lower extremity edema Breast:  denies any pain or lumps or nodules in either breasts All other systems were reviewed with the patient and are negative.  I have reviewed the past medical history, past surgical history, social history and family history with the patient and they are unchanged from previous note.  ALLERGIES:  is allergic to brimonidine; dorzolamide; and dorzolamide hcl-timolol mal.  MEDICATIONS:  Current Outpatient Medications  Medication Sig Dispense Refill  . acetaminophen (TYLENOL) 500 MG tablet Take 500 mg by mouth as needed for mild pain.    Marland Kitchen anastrozole (ARIMIDEX) 1 MG tablet Take 1 tablet (1 mg total) by mouth daily. 90 tablet 3  . atorvastatin (LIPITOR) 20 MG tablet Take 20 mg by mouth daily.    . Calcium Carb-Cholecalciferol (CALCIUM + D3) 600-200 MG-UNIT TABS Take 1 tablet by mouth 2 (two) times daily.    . clonazePAM (KLONOPIN) 1 MG tablet Take 0.5-1 mg by mouth 2 (two) times daily as needed for anxiety.     . hydrochlorothiazide (  HYDRODIURIL) 25 MG tablet Take 25 mg by mouth daily.    Marland Kitchen ibuprofen (ADVIL,MOTRIN) 200 MG tablet Take 200 mg by mouth every 6 (six) hours as needed.    . latanoprost (XALATAN) 0.005 % ophthalmic solution Place 1 drop into the left eye at bedtime.     Marland Kitchen levothyroxine (SYNTHROID, LEVOTHROID) 150 MCG tablet Take 150 mcg by mouth daily before breakfast.  0  . non-metallic deodorant (ALRA) MISC Apply 1 application topically daily as needed.      . Omega-3 Fatty Acids (FISH OIL) 1000 MG CAPS Take 2,000 mg by mouth 2 (two) times daily.     Marland Kitchen OVER THE COUNTER MEDICATION Take 2,000 mg by mouth daily. Cinnamon '1000mg'$     . potassium chloride (K-DUR,KLOR-CON) 10 MEQ tablet Take 20 mEq by mouth daily.   0  . telmisartan (MICARDIS) 40 MG tablet Take 40 mg by mouth daily.    . timolol (BETIMOL) 0.5 % ophthalmic solution Place 1 drop into both eyes 2 (two) times daily. 10 mL 12   No current facility-administered medications for this visit.     PHYSICAL EXAMINATION: ECOG PERFORMANCE STATUS: 1 - Symptomatic but completely ambulatory  Vitals:   07/16/17 1010  BP: (!) 134/99  Pulse: 98  Resp: 16  Temp: 98.4 F (36.9 C)  SpO2: 98%   Filed Weights   07/16/17 1010  Weight: 179 lb 11.2 oz (81.5 kg)    GENERAL:alert, no distress and comfortable SKIN: skin color, texture, turgor are normal, no rashes or significant lesions EYES: normal, Conjunctiva are pink and non-injected, sclera clear OROPHARYNX:no exudate, no erythema and lips, buccal mucosa, and tongue normal  NECK: supple, thyroid normal size, non-tender, without nodularity LYMPH:  no palpable lymphadenopathy in the cervical, axillary or inguinal LUNGS: clear to auscultation and percussion with normal breathing effort HEART: regular rate & rhythm and no murmurs and no lower extremity edema ABDOMEN:abdomen soft, non-tender and normal bowel sounds MUSCULOSKELETAL:no cyanosis of digits and no clubbing  NEURO: alert & oriented x 3 with fluent speech, no focal motor/sensory deficits EXTREMITIES: No lower extremity edema BREAST: No palpable masses or nodules in either right or left breasts. No palpable axillary supraclavicular or infraclavicular adenopathy no breast tenderness or nipple discharge. (exam performed in the presence of a chaperone)  LABORATORY DATA:  I have reviewed the data as listed CMP Latest Ref Rng & Units 06/13/2015 06/01/2015 07/17/2010  Glucose 65 - 99 mg/dL 108(H)  114 108(H)  BUN 6 - 20 mg/dL 17 28.2(H) 23  Creatinine 0.44 - 1.00 mg/dL 1.00 1.2(H) 1.50(H)  Sodium 135 - 145 mmol/L 136 139 137  Potassium 3.5 - 5.1 mmol/L 3.8 3.3(L) 3.6  Chloride 101 - 111 mmol/L 96(L) - 103  CO2 22 - 32 mmol/L 28 30(H) 21  Calcium 8.9 - 10.3 mg/dL 10.0 10.1 9.3  Total Protein 6.4 - 8.3 g/dL - 7.4 7.1  Total Bilirubin 0.20 - 1.20 mg/dL - 0.48 0.4  Alkaline Phos 40 - 150 U/L - 141 121(H)  AST 5 - 34 U/L - 15 25  ALT 0 - 55 U/L - 21 23    Lab Results  Component Value Date   WBC 7.6 06/01/2015   HGB 14.1 06/01/2015   HCT 40.7 06/01/2015   MCV 87.7 06/01/2015   PLT 280 06/01/2015   NEUTROABS 5.5 06/01/2015    ASSESSMENT & PLAN:  Breast cancer of lower-outer quadrant of right female breast ([place]) Right breast biopsy 8:00 position 05/23/2015: Invasive ductal carcinoma with DCIS,  grade 1, ER 100%, PR 100%, Ki-67 10%, HER-2 negative ratio 1.36, mammogram revealed a 10 mm mass 8 cm from the nipple, T1b N0 stage IA clinical stage Right lumpectomy: IDC with DCIS, 1 cm, invasive cancer focally at anterior margin, 0/4 lymph nodes negative, T1 BN 0 stage IA; 3 resection of the anterior margin is negative for cancer, Oncotype DX score 7, 5% ROR Adj XRT 5/30- 08/26/15  Current Treatment: Anastrozole started Aug 2017  Anastrozole Toxicities: Denies any hot flashes or myalgias.  Survivorship: Patient has been exercising 3-4 times a week with the Silver sneakers program at [facility] cancer surveillance: Mammograms March 2019: Benign breast density category B. Patient informed me that she gets breast exams by her gynecologist.  She did not want to get an exam today.  I sent a new prescription for anastrozole to her pharmacy Return to clinic in 1 year for follow-up    No orders of the defined types were placed in this encounter.  The patient has a good understanding of the overall plan. she agrees with it. she will call with any problems that may  develop before the next visit here.   Harriette Ohara, MD 07/16/17

## 2017-07-16 NOTE — Assessment & Plan Note (Signed)
Right breast biopsy 8:00 position 05/23/2015: Invasive ductal carcinoma with DCIS, grade 1, ER 100%, PR 100%, Ki-67 10%, HER-2 negative ratio 1.36, mammogram revealed a 10 mm mass 8 cm from the nipple, T1b N0 stage IA clinical stage Right lumpectomy: IDC with DCIS, 1 cm, invasive cancer focally at anterior margin, 0/4 lymph nodes negative, T1 BN 0 stage IA; 3 resection of the anterior margin is negative for cancer, Oncotype DX score 7, 5% ROR Adj XRT 5/30- 08/26/15  Current Treatment: Anastrozole started Aug 2017  Anastrozole Toxicities: Denies any hot flashes or myalgias.  Survivorship: Patient has been exercising 3-4 times a week with the Silver sneakers program at [place] cancer surveillance: Mammograms March 2019: Benign breast density category B. Patient informed me that she gets breast exams by her gynecologist.  She did not want to get an exam today.  I sent a new prescription for anastrozole to her pharmacy Return to clinic in 1 year for follow-up

## 2018-04-03 ENCOUNTER — Other Ambulatory Visit: Payer: Self-pay | Admitting: Hematology and Oncology

## 2018-04-03 DIAGNOSIS — Z853 Personal history of malignant neoplasm of breast: Secondary | ICD-10-CM

## 2018-05-19 ENCOUNTER — Other Ambulatory Visit: Payer: Self-pay

## 2018-05-19 ENCOUNTER — Ambulatory Visit
Admission: RE | Admit: 2018-05-19 | Discharge: 2018-05-19 | Disposition: A | Discharge status: Home / Self Care | Payer: Medicare Other | Source: Ambulatory Visit | Attending: Hematology and Oncology | Admitting: Hematology and Oncology

## 2018-05-19 DIAGNOSIS — Z853 Personal history of malignant neoplasm of breast: Secondary | ICD-10-CM

## 2018-07-08 NOTE — Assessment & Plan Note (Signed)
Right breast biopsy 8:00 position 05/23/2015: Invasive ductal carcinoma with DCIS, grade 1, ER 100%, PR 100%, Ki-67 10%, HER-2 negative ratio 1.36, mammogram revealed a 10 mm mass 8 cm from the nipple, T1b N0 stage IA clinical stage Right lumpectomy: IDC with DCIS, 1 cm, invasive cancer focally at anterior margin, 0/4 lymph nodes negative, T1 BN 0 stage IA; 3 resection of the anterior margin is negative for cancer, Oncotype DX score 7, 5% ROR Adj XRT 5/30- 08/26/15  Current Treatment: Anastrozole started Aug 2017  Anastrozole Toxicities: Denies any hot flashes or myalgias.  Survivorship:Patient has been exercising 3-4 times a week with the Silver sneakers program at [place] surveillance: Mammograms March 2020: Benign breast density category B. Patient informed me that she gets breast exams by her gynecologist.  I sent a new prescription for anastrozole to her pharmacy Return to clinic in 1 year for follow-up

## 2018-07-10 ENCOUNTER — Telehealth: Payer: Self-pay | Admitting: Hematology and Oncology

## 2018-07-10 NOTE — Telephone Encounter (Signed)
Called patient regarding upcoming Webex appointment, test run is complete and sent patient the Webex invite.

## 2018-07-12 NOTE — Progress Notes (Signed)
HEMATOLOGY-ONCOLOGY [place] VISIT PROGRESS NOTE  I connected with Tiffany Thompson on 07/14/2018 at 10:30 AM EDT by Webex video conference and verified that I am speaking with the correct person using two identifiers.  I discussed the limitations, risks, security and privacy concerns of performing an evaluation and management service by Webex and the availability of in person appointments.  I also discussed with the patient that there may be a patient responsible charge related to this service. The patient expressed understanding and agreed to proceed.  Patient's Location: Home Physician Location: Clinic  CHIEF COMPLIANT: Follow-up on anastrozole therapy  INTERVAL HISTORY: Tiffany Thompson is a right breast cancer treated with lumpectomy, radiation, and is currently on adjuvant antiestrogen therapy with anastrozole. I last saw her a year ago. Her most recent mammogram on 05/19/18 showed no evidence of malignancy. She presents over Webex today for annual follow-up.     Breast cancer of lower-outer quadrant of right female breast ([place])   05/23/2015 Initial Diagnosis    Right breast biopsy 8:00 position: Invasive ductal carcinoma with DCIS, grade 1, ER 100%, PR 100%, Ki-67 10%, HER-2 negative ratio 1.36, mammogram revealed a 10 mm mass 8 cm from the nipple, T1b N0 stage IA clinical stage    06/20/2015 Surgery    Right lumpectomy [name]): IDC with DCIS, 1 cm, invasive cancer focally at anterior margin, 0/4 lymph nodes negative, T1 BN 0 stage IA; resection of the anterior margin is negative for cancer, Oncotype DX score 7, 5% ROR    06/20/2015 Oncotype testing    Recurrence score: 7; 5% ROR (low-risk)    06/29/2015 Surgery    Right breast re-excision [name]): No tumor.     07/26/2015 - 08/22/2015 Radiation Therapy    Adjuvant breast radiation [name]). The Right breast was treated to 42.5 Gy in 17 fractions at 2.5 Gy per fraction.  The Right breast was boosted to  7.5 Gy in 3 fractions  at 2.5 Gy per fraction.     08/2015 -  Anti-estrogen oral therapy    Anastrozole 1 mg daily. Planned duration of therapy: 5 years.      REVIEW OF SYSTEMS:   Constitutional: Denies fevers, chills or abnormal weight loss Eyes: Denies blurriness of vision Ears, nose, mouth, throat, and face: Denies mucositis or sore throat Respiratory: Denies cough, dyspnea or wheezes Cardiovascular: Denies palpitation, chest discomfort Gastrointestinal:  Denies nausea, heartburn or change in bowel habits Skin: Denies abnormal skin rashes Lymphatics: Denies new lymphadenopathy or easy bruising Neurological:Denies numbness, tingling or new weaknesses Behavioral/Psych: Mood is stable, no new changes  Extremities: No lower extremity edema Breast: denies any pain or lumps or nodules in either breasts All other systems were reviewed with the patient and are negative.  Observations/Objective:  There were no vitals filed for this visit. There is no height or weight on file to calculate BMI.  I have reviewed the data as listed CMP Latest Ref Rng & Units 06/13/2015 06/01/2015 07/17/2010  Glucose 65 - 99 mg/dL 108(H) 114 108(H)  BUN 6 - 20 mg/dL 17 28.2(H) 23  Creatinine 0.44 - 1.00 mg/dL 1.00 1.2(H) 1.50(H)  Sodium 135 - 145 mmol/L 136 139 137  Potassium 3.5 - 5.1 mmol/L 3.8 3.3(L) 3.6  Chloride 101 - 111 mmol/L 96(L) - 103  CO2 22 - 32 mmol/L 28 30(H) 21  Calcium 8.9 - 10.3 mg/dL 10.0 10.1 9.3  Total Protein 6.4 - 8.3 g/dL - 7.4 7.1  Total Bilirubin 0.20 - 1.20 mg/dL - 0.48  0.4  Alkaline Phos 40 - 150 U/L - 141 121(H)  AST 5 - 34 U/L - 15 25  ALT 0 - 55 U/L - 21 23    Lab Results  Component Value Date   WBC 7.6 06/01/2015   HGB 14.1 06/01/2015   HCT 40.7 06/01/2015   MCV 87.7 06/01/2015   PLT 280 06/01/2015   NEUTROABS 5.5 06/01/2015      Assessment Plan:  Breast cancer of lower-outer quadrant of right female breast ([place]) Right breast biopsy 8:00 position 05/23/2015: Invasive ductal carcinoma  with DCIS, grade 1, ER 100%, PR 100%, Ki-67 10%, HER-2 negative ratio 1.36, mammogram revealed a 10 mm mass 8 cm from the nipple, T1b N0 stage IA clinical stage Right lumpectomy: IDC with DCIS, 1 cm, invasive cancer focally at anterior margin, 0/4 lymph nodes negative, T1 BN 0 stage IA; 3 resection of the anterior margin is negative for cancer, Oncotype DX score 7, 5% ROR Adj XRT 5/30- 08/26/15  Current Treatment: Anastrozole started Aug 2017  Anastrozole Toxicities: Denies any hot flashes or myalgias.  Survivorship:Patient has been exercising 3-4 times a week with the Silver sneakers program at [place] surveillance: Mammograms March 2020: Benign breast density category B. Patient informed me that she gets breast exams by her gynecologist.  I sent a new prescription for anastrozole to her pharmacy Return to clinic in 1 year for follow-up    I discussed the assessment and treatment plan with the patient. The patient was provided an opportunity to ask questions and all were answered. The patient agreed with the plan and demonstrated an understanding of the instructions. The patient was advised to call back or seek an in-person evaluation if the symptoms worsen or if the condition fails to improve as anticipated.   I provided 15 minutes of face-to-face Web Ex time during this encounter.    Rulon Eisenmenger, MD 07/14/2018   I, Molly Dorshimer, am acting as scribe for Nicholas Lose, MD.  I have reviewed the above documentation for accuracy and completeness, and I agree with the above.

## 2018-07-14 ENCOUNTER — Inpatient Hospital Stay: Payer: Medicare Other | Attending: Hematology and Oncology | Admitting: Hematology and Oncology

## 2018-07-14 DIAGNOSIS — Z170 Estrogen receptor positive status [ER+]: Secondary | ICD-10-CM | POA: Diagnosis not present

## 2018-07-14 DIAGNOSIS — Z953 Presence of xenogenic heart valve: Secondary | ICD-10-CM

## 2018-07-14 DIAGNOSIS — C50511 Malignant neoplasm of lower-outer quadrant of right female breast: Secondary | ICD-10-CM

## 2018-07-14 MED ORDER — LEVOTHYROXINE SODIUM 137 MCG PO TABS: 137.0000 ug | ORAL_TABLET | Freq: Every day | ORAL | Status: AC

## 2018-07-14 MED ORDER — ANASTROZOLE 1 MG PO TABS: 1.0000 mg | ORAL_TABLET | Freq: Every day | ORAL | 3 refills | Status: DC

## 2019-04-09 ENCOUNTER — Other Ambulatory Visit: Payer: Self-pay | Admitting: Hematology and Oncology

## 2019-04-09 DIAGNOSIS — Z9889 Other specified postprocedural states: Secondary | ICD-10-CM

## 2019-04-09 DIAGNOSIS — Z853 Personal history of malignant neoplasm of breast: Secondary | ICD-10-CM

## 2019-04-18 ENCOUNTER — Ambulatory Visit: Payer: Medicare PPO | Attending: Internal Medicine

## 2019-04-18 DIAGNOSIS — Z23 Encounter for immunization: Secondary | ICD-10-CM | POA: Insufficient documentation

## 2019-04-18 NOTE — Progress Notes (Signed)
   Covid-19 Vaccination Clinic  Name:  Takeyla Vong    MRN: [id number] DOB: 1950-03-09  04/18/2019  Ms. Dubbert was observed post Covid-19 immunization for 15 minutes without incidence. She was provided with Vaccine Information Sheet and instruction to access the V-Safe system.   Ms. Sunada was instructed to call 911 with any severe reactions post vaccine: Marland Kitchen Difficulty breathing  . Swelling of your face and throat  . A fast heartbeat  . A bad rash all over your body  . Dizziness and weakness    Immunizations Administered    Name Date Dose VIS Date Route   Pfizer COVID-19 Vaccine 04/18/2019  9:43 AM 0.3 mL 02/06/2019 Intramuscular   Manufacturer: [place]   Lot: [id number]   [place]: [id number]

## 2019-05-06 ENCOUNTER — Other Ambulatory Visit: Payer: Self-pay | Admitting: Family Medicine

## 2019-05-06 DIAGNOSIS — M8580 Other specified disorders of bone density and structure, unspecified site: Secondary | ICD-10-CM

## 2019-05-12 ENCOUNTER — Ambulatory Visit: Payer: Medicare PPO | Attending: Internal Medicine

## 2019-05-12 DIAGNOSIS — Z23 Encounter for immunization: Secondary | ICD-10-CM

## 2019-05-12 NOTE — Progress Notes (Signed)
   Covid-19 Vaccination Clinic  Name:  Tiffany Thompson    MRN: [id number] DOB: March 29, 1950  05/12/2019  Tiffany Thompson was observed post Covid-19 immunization for 15 minutes without incident. She was provided with Vaccine Information Sheet and instruction to access the V-Safe system.   Tiffany Thompson was instructed to call 911 with any severe reactions post vaccine: Marland Kitchen Difficulty breathing  . Swelling of face and throat  . A fast heartbeat  . A bad rash all over body  . Dizziness and weakness   Immunizations Administered    Name Date Dose VIS Date Route   Pfizer COVID-19 Vaccine 05/12/2019 10:36 AM 0.3 mL 02/06/2019 Intramuscular   Manufacturer: [place]   Lot: [id number]   [place]: [id number]

## 2019-06-23 ENCOUNTER — Ambulatory Visit
Admission: RE | Admit: 2019-06-23 | Discharge: 2019-06-23 | Disposition: A | Discharge status: Home / Self Care | Payer: Medicare PPO | Source: Ambulatory Visit | Attending: Hematology and Oncology | Admitting: Hematology and Oncology

## 2019-06-23 ENCOUNTER — Other Ambulatory Visit: Payer: Self-pay | Admitting: Hematology and Oncology

## 2019-06-23 ENCOUNTER — Other Ambulatory Visit: Payer: Self-pay

## 2019-06-23 DIAGNOSIS — R928 Other abnormal and inconclusive findings on diagnostic imaging of breast: Secondary | ICD-10-CM

## 2019-06-23 DIAGNOSIS — Z853 Personal history of malignant neoplasm of breast: Secondary | ICD-10-CM

## 2019-06-23 DIAGNOSIS — Z9889 Other specified postprocedural states: Secondary | ICD-10-CM

## 2019-06-29 DIAGNOSIS — H2512 Age-related nuclear cataract, left eye: Secondary | ICD-10-CM | POA: Diagnosis not present

## 2019-06-29 DIAGNOSIS — H34813 Central retinal vein occlusion, bilateral, with macular edema: Secondary | ICD-10-CM | POA: Diagnosis not present

## 2019-06-29 DIAGNOSIS — H35373 Puckering of macula, bilateral: Secondary | ICD-10-CM | POA: Diagnosis not present

## 2019-06-29 DIAGNOSIS — H4052X0 Glaucoma secondary to other eye disorders, left eye, stage unspecified: Secondary | ICD-10-CM | POA: Diagnosis not present

## 2019-06-29 DIAGNOSIS — H43811 Vitreous degeneration, right eye: Secondary | ICD-10-CM | POA: Diagnosis not present

## 2019-06-29 DIAGNOSIS — Z961 Presence of intraocular lens: Secondary | ICD-10-CM | POA: Diagnosis not present

## 2019-06-29 DIAGNOSIS — H35353 Cystoid macular degeneration, bilateral: Secondary | ICD-10-CM | POA: Diagnosis not present

## 2019-06-29 DIAGNOSIS — H26491 Other secondary cataract, right eye: Secondary | ICD-10-CM | POA: Diagnosis not present

## 2019-07-13 NOTE — Progress Notes (Signed)
Patient Care Team: Rankins, Bill Salinas, MD as PCP - General (Family Medicine)  DIAGNOSIS:    ICD-10-CM   1. Malignant neoplasm of lower-outer quadrant of right breast of female, estrogen receptor positive ([place])  C50.511 anastrozole (ARIMIDEX) 1 MG tablet   Z17.0     SUMMARY OF ONCOLOGIC HISTORY: Oncology History  Breast cancer of lower-outer quadrant of right female breast ([place])  05/23/2015 Initial Diagnosis   Right breast biopsy 8:00 position: Invasive ductal carcinoma with DCIS, grade 1, ER 100%, PR 100%, Ki-67 10%, HER-2 negative ratio 1.36, mammogram revealed a 10 mm mass 8 cm from the nipple, T1b N0 stage IA clinical stage   06/20/2015 Surgery   Right lumpectomy [name]): IDC with DCIS, 1 cm, invasive cancer focally at anterior margin, 0/4 lymph nodes negative, T1 BN 0 stage IA; resection of the anterior margin is negative for cancer, Oncotype DX score 7, 5% ROR   06/20/2015 Oncotype testing   Recurrence score: 7; 5% ROR (low-risk)   06/29/2015 Surgery   Right breast re-excision [name]): No tumor.    07/26/2015 - 08/22/2015 Radiation Therapy   Adjuvant breast radiation [name]). The Right breast was treated to 42.5 Gy in 17 fractions at 2.5 Gy per fraction.  The Right breast was boosted to  7.5 Gy in 3 fractions at 2.5 Gy per fraction.    08/2015 -  Anti-estrogen oral therapy   Anastrozole 1 mg daily. Planned duration of therapy: 5 years.      CHIEF COMPLIANT: Follow-up of right breast cancer on anastrozole therapy  INTERVAL HISTORY: Tiffany Thompson is a 69 y.o. with above-mentioned history of right breast cancer treated with lumpectomy, radiation, and is currently on antiestrogen therapy with anastrozole. Mammogram on 06/23/19 showed no evidence of malignancy bilaterally, and a benign left breast cyst. She presents to the clinic today for annual follow-up.   ALLERGIES:  is allergic to brimonidine; dorzolamide; and dorzolamide hcl-timolol mal.  MEDICATIONS:  Current  Outpatient Medications  Medication Sig Dispense Refill  . acetaminophen (TYLENOL) 500 MG tablet Take 500 mg by mouth as needed for mild pain.    Marland Kitchen anastrozole (ARIMIDEX) 1 MG tablet Take 1 tablet (1 mg total) by mouth daily. 90 tablet 3  . atorvastatin (LIPITOR) 20 MG tablet Take 20 mg by mouth daily.    . Calcium Carb-Cholecalciferol (CALCIUM + D3) 600-200 MG-UNIT TABS Take 1 tablet by mouth 2 (two) times daily.    . clonazePAM (KLONOPIN) 1 MG tablet Take 0.5-1 mg by mouth 2 (two) times daily as needed for anxiety.     . hydrochlorothiazide (HYDRODIURIL) 25 MG tablet Take 25 mg by mouth daily.    Marland Kitchen ibuprofen (ADVIL,MOTRIN) 200 MG tablet Take 200 mg by mouth every 6 (six) hours as needed.    . latanoprost (XALATAN) 0.005 % ophthalmic solution Place 1 drop into the left eye at bedtime.     Marland Kitchen levothyroxine (SYNTHROID) 137 MCG tablet Take 1 tablet (137 mcg total) by mouth daily before breakfast.    . non-metallic deodorant (ALRA) MISC Apply 1 application topically daily as needed.    . Omega-3 Fatty Acids (FISH OIL) 1000 MG CAPS Take 2,000 mg by mouth 2 (two) times daily.     Marland Kitchen OVER THE COUNTER MEDICATION Take 2,000 mg by mouth daily. Cinnamon 1040m    . potassium chloride (K-DUR,KLOR-CON) 10 MEQ tablet Take 20 mEq by mouth daily.   0  . telmisartan (MICARDIS) 40 MG tablet Take 40 mg by mouth daily.    .Marland Kitchen  timolol (BETIMOL) 0.5 % ophthalmic solution Place 1 drop into both eyes 2 (two) times daily. 10 mL 12   No current facility-administered medications for this visit.    PHYSICAL EXAMINATION: ECOG PERFORMANCE STATUS: 1 - Symptomatic but completely ambulatory  Vitals:   07/14/19 1124  BP: (!) 128/91  Pulse: (!) 107  Resp: 18  Temp: 97.8 F (36.6 C)  SpO2: 95%   Filed Weights   07/14/19 1124  Weight: 191 lb 1.6 oz (86.7 kg)   LABORATORY DATA:  I have reviewed the data as listed CMP Latest Ref Rng & Units 06/13/2015 06/01/2015 07/17/2010  Glucose 65 - 99 mg/dL 108(H) 114 108(H)  BUN 6 -  20 mg/dL 17 28.2(H) 23  Creatinine 0.44 - 1.00 mg/dL 1.00 1.2(H) 1.50(H)  Sodium 135 - 145 mmol/L 136 139 137  Potassium 3.5 - 5.1 mmol/L 3.8 3.3(L) 3.6  Chloride 101 - 111 mmol/L 96(L) - 103  CO2 22 - 32 mmol/L 28 30(H) 21  Calcium 8.9 - 10.3 mg/dL 10.0 10.1 9.3  Total Protein 6.4 - 8.3 g/dL - 7.4 7.1  Total Bilirubin 0.20 - 1.20 mg/dL - 0.48 0.4  Alkaline Phos 40 - 150 U/L - 141 121(H)  AST 5 - 34 U/L - 15 25  ALT 0 - 55 U/L - 21 23    Lab Results  Component Value Date   WBC 7.6 06/01/2015   HGB 14.1 06/01/2015   HCT 40.7 06/01/2015   MCV 87.7 06/01/2015   PLT 280 06/01/2015   NEUTROABS 5.5 06/01/2015    ASSESSMENT & PLAN:  Breast cancer of lower-outer quadrant of right female breast ([place]) Right breast biopsy 8:00 position 05/23/2015: Invasive ductal carcinoma with DCIS, grade 1, ER 100%, PR 100%, Ki-67 10%, HER-2 negative ratio 1.36, mammogram revealed a 10 mm mass 8 cm from the nipple, T1b N0 stage IA clinical stage Right lumpectomy: IDC with DCIS, 1 cm, invasive cancer focally at anterior margin, 0/4 lymph nodes negative, T1 BN 0 stage IA; 3 resection of the anterior margin is negative for cancer, Oncotype DX score 7, 5% ROR Adj XRT 5/30- 08/26/15  Current Treatment: Anastrozole started Aug 2017  Anastrozole Toxicities: Denies any hot flashes or myalgias.  Survivorship:Patient has been exercising 3-4 times a weekwith the Silver sneakers program at [place] cancer surveillance: Mammograms 06/23/2019: Benign breast density category B.  Targeted ultrasound showed benign cyst 2 o'clock position measuring 6 mm. Patient informed me that she gets breast exams by her gynecologist.  I sent a new prescription for anastrozole to her pharmacy Return to clinic in 1 year for follow-up for 1 final visit.    No orders of the defined types were placed in this encounter.  The patient has a good understanding of the overall plan. she agrees with it. she will call  with any problems that may develop before the next visit here.  Total time spent: 20 mins including face to face time and time spent for planning, charting and coordination of care  Nicholas Lose, MD 07/14/2019  I, Cloyde Reams Dorshimer, am acting as scribe for Dr. Nicholas Lose.  I have reviewed the above documentation for accuracy and completeness, and I agree with the above.

## 2019-07-14 ENCOUNTER — Other Ambulatory Visit: Payer: Self-pay

## 2019-07-14 ENCOUNTER — Inpatient Hospital Stay: Payer: Medicare PPO | Attending: Hematology and Oncology | Admitting: Hematology and Oncology

## 2019-07-14 DIAGNOSIS — Z79811 Long term (current) use of aromatase inhibitors: Secondary | ICD-10-CM | POA: Diagnosis not present

## 2019-07-14 DIAGNOSIS — Z923 Personal history of irradiation: Secondary | ICD-10-CM | POA: Diagnosis not present

## 2019-07-14 DIAGNOSIS — Z170 Estrogen receptor positive status [ER+]: Secondary | ICD-10-CM

## 2019-07-14 DIAGNOSIS — C50511 Malignant neoplasm of lower-outer quadrant of right female breast: Secondary | ICD-10-CM | POA: Diagnosis not present

## 2019-07-14 MED ORDER — ANASTROZOLE 1 MG PO TABS: 1.0000 mg | ORAL_TABLET | Freq: Every day | ORAL | 3 refills | Status: DC

## 2019-07-14 NOTE — Assessment & Plan Note (Addendum)
Right breast biopsy 8:00 position 05/23/2015: Invasive ductal carcinoma with DCIS, grade 1, ER 100%, PR 100%, Ki-67 10%, HER-2 negative ratio 1.36, mammogram revealed a 10 mm mass 8 cm from the nipple, T1b N0 stage IA clinical stage Right lumpectomy: IDC with DCIS, 1 cm, invasive cancer focally at anterior margin, 0/4 lymph nodes negative, T1 BN 0 stage IA; 3 resection of the anterior margin is negative for cancer, Oncotype DX score 7, 5% ROR Adj XRT 5/30- 08/26/15  Current Treatment: Anastrozole started Aug 2017  Anastrozole Toxicities: Denies any hot flashes or myalgias.  Survivorship:Patient has been exercising 3-4 times a weekwith the Silver sneakers program at [place] cancer surveillance: Mammograms 06/23/2019: Benign breast density category B.  Targeted ultrasound showed benign cyst 2 o'clock position measuring 6 mm. Patient informed me that she gets breast exams by her gynecologist.  I sent a new prescription for anastrozole to her pharmacy Return to clinic in 1 year for follow-up for 1 final visit.

## 2019-08-03 DIAGNOSIS — H34813 Central retinal vein occlusion, bilateral, with macular edema: Secondary | ICD-10-CM | POA: Diagnosis not present

## 2019-08-24 ENCOUNTER — Ambulatory Visit
Admission: RE | Admit: 2019-08-24 | Discharge: 2019-08-24 | Disposition: A | Discharge status: Home / Self Care | Payer: Medicare PPO | Source: Ambulatory Visit | Attending: Family Medicine | Admitting: Family Medicine

## 2019-08-24 ENCOUNTER — Other Ambulatory Visit: Payer: Self-pay

## 2019-08-24 DIAGNOSIS — M8580 Other specified disorders of bone density and structure, unspecified site: Secondary | ICD-10-CM

## 2019-08-24 DIAGNOSIS — M85852 Other specified disorders of bone density and structure, left thigh: Secondary | ICD-10-CM | POA: Diagnosis not present

## 2019-08-24 DIAGNOSIS — Z780 Asymptomatic menopausal state: Secondary | ICD-10-CM | POA: Diagnosis not present

## 2019-09-02 DIAGNOSIS — H34813 Central retinal vein occlusion, bilateral, with macular edema: Secondary | ICD-10-CM | POA: Diagnosis not present

## 2019-10-05 DIAGNOSIS — H34813 Central retinal vein occlusion, bilateral, with macular edema: Secondary | ICD-10-CM | POA: Diagnosis not present

## 2019-10-26 DIAGNOSIS — H401112 Primary open-angle glaucoma, right eye, moderate stage: Secondary | ICD-10-CM | POA: Diagnosis not present

## 2019-10-26 DIAGNOSIS — H4052X3 Glaucoma secondary to other eye disorders, left eye, severe stage: Secondary | ICD-10-CM | POA: Diagnosis not present

## 2019-11-09 DIAGNOSIS — H34813 Central retinal vein occlusion, bilateral, with macular edema: Secondary | ICD-10-CM | POA: Diagnosis not present

## 2019-11-09 DIAGNOSIS — H35371 Puckering of macula, right eye: Secondary | ICD-10-CM | POA: Diagnosis not present

## 2019-11-09 DIAGNOSIS — H35351 Cystoid macular degeneration, right eye: Secondary | ICD-10-CM | POA: Diagnosis not present

## 2019-11-10 DIAGNOSIS — E039 Hypothyroidism, unspecified: Secondary | ICD-10-CM | POA: Diagnosis not present

## 2019-11-10 DIAGNOSIS — Z23 Encounter for immunization: Secondary | ICD-10-CM | POA: Diagnosis not present

## 2019-11-10 DIAGNOSIS — E7800 Pure hypercholesterolemia, unspecified: Secondary | ICD-10-CM | POA: Diagnosis not present

## 2019-11-10 DIAGNOSIS — F419 Anxiety disorder, unspecified: Secondary | ICD-10-CM | POA: Diagnosis not present

## 2019-11-10 DIAGNOSIS — I10 Essential (primary) hypertension: Secondary | ICD-10-CM | POA: Diagnosis not present

## 2019-11-10 DIAGNOSIS — R7303 Prediabetes: Secondary | ICD-10-CM | POA: Diagnosis not present

## 2019-11-10 DIAGNOSIS — Z853 Personal history of malignant neoplasm of breast: Secondary | ICD-10-CM | POA: Diagnosis not present

## 2019-12-07 DIAGNOSIS — H34813 Central retinal vein occlusion, bilateral, with macular edema: Secondary | ICD-10-CM | POA: Diagnosis not present

## 2020-01-04 DIAGNOSIS — H34813 Central retinal vein occlusion, bilateral, with macular edema: Secondary | ICD-10-CM | POA: Diagnosis not present

## 2020-02-01 DIAGNOSIS — H34813 Central retinal vein occlusion, bilateral, with macular edema: Secondary | ICD-10-CM | POA: Diagnosis not present

## 2020-02-15 DIAGNOSIS — E7800 Pure hypercholesterolemia, unspecified: Secondary | ICD-10-CM | POA: Diagnosis not present

## 2020-02-29 DIAGNOSIS — H409 Unspecified glaucoma: Secondary | ICD-10-CM | POA: Diagnosis not present

## 2020-02-29 DIAGNOSIS — H34813 Central retinal vein occlusion, bilateral, with macular edema: Secondary | ICD-10-CM | POA: Diagnosis not present

## 2020-02-29 DIAGNOSIS — H43811 Vitreous degeneration, right eye: Secondary | ICD-10-CM | POA: Diagnosis not present

## 2020-02-29 DIAGNOSIS — H35371 Puckering of macula, right eye: Secondary | ICD-10-CM | POA: Diagnosis not present

## 2020-02-29 DIAGNOSIS — H269 Unspecified cataract: Secondary | ICD-10-CM | POA: Diagnosis not present

## 2020-03-07 DIAGNOSIS — H401112 Primary open-angle glaucoma, right eye, moderate stage: Secondary | ICD-10-CM | POA: Diagnosis not present

## 2020-03-07 DIAGNOSIS — H4052X3 Glaucoma secondary to other eye disorders, left eye, severe stage: Secondary | ICD-10-CM | POA: Diagnosis not present

## 2020-03-28 DIAGNOSIS — H34813 Central retinal vein occlusion, bilateral, with macular edema: Secondary | ICD-10-CM | POA: Diagnosis not present

## 2020-04-25 DIAGNOSIS — H34813 Central retinal vein occlusion, bilateral, with macular edema: Secondary | ICD-10-CM | POA: Diagnosis not present

## 2020-05-09 ENCOUNTER — Other Ambulatory Visit: Payer: Self-pay | Admitting: Hematology and Oncology

## 2020-05-09 DIAGNOSIS — Z9889 Other specified postprocedural states: Secondary | ICD-10-CM

## 2020-05-16 DIAGNOSIS — E039 Hypothyroidism, unspecified: Secondary | ICD-10-CM | POA: Diagnosis not present

## 2020-05-16 DIAGNOSIS — H4052X3 Glaucoma secondary to other eye disorders, left eye, severe stage: Secondary | ICD-10-CM | POA: Diagnosis not present

## 2020-05-16 DIAGNOSIS — M859 Disorder of bone density and structure, unspecified: Secondary | ICD-10-CM | POA: Diagnosis not present

## 2020-05-16 DIAGNOSIS — F419 Anxiety disorder, unspecified: Secondary | ICD-10-CM | POA: Diagnosis not present

## 2020-05-16 DIAGNOSIS — H401112 Primary open-angle glaucoma, right eye, moderate stage: Secondary | ICD-10-CM | POA: Diagnosis not present

## 2020-05-16 DIAGNOSIS — E7800 Pure hypercholesterolemia, unspecified: Secondary | ICD-10-CM | POA: Diagnosis not present

## 2020-05-16 DIAGNOSIS — I10 Essential (primary) hypertension: Secondary | ICD-10-CM | POA: Diagnosis not present

## 2020-05-16 DIAGNOSIS — Z853 Personal history of malignant neoplasm of breast: Secondary | ICD-10-CM | POA: Diagnosis not present

## 2020-05-16 DIAGNOSIS — R7303 Prediabetes: Secondary | ICD-10-CM | POA: Diagnosis not present

## 2020-05-16 DIAGNOSIS — Z0000 Encounter for general adult medical examination without abnormal findings: Secondary | ICD-10-CM | POA: Diagnosis not present

## 2020-05-23 DIAGNOSIS — H34813 Central retinal vein occlusion, bilateral, with macular edema: Secondary | ICD-10-CM | POA: Diagnosis not present

## 2020-06-20 DIAGNOSIS — H34813 Central retinal vein occlusion, bilateral, with macular edema: Secondary | ICD-10-CM | POA: Diagnosis not present

## 2020-06-27 ENCOUNTER — Other Ambulatory Visit: Payer: Self-pay

## 2020-06-27 ENCOUNTER — Ambulatory Visit
Admission: RE | Admit: 2020-06-27 | Discharge: 2020-06-27 | Disposition: A | Discharge status: Home / Self Care | Payer: Medicare PPO | Source: Ambulatory Visit | Attending: Hematology and Oncology | Admitting: Hematology and Oncology

## 2020-06-27 DIAGNOSIS — Z9889 Other specified postprocedural states: Secondary | ICD-10-CM

## 2020-06-27 DIAGNOSIS — R922 Inconclusive mammogram: Secondary | ICD-10-CM | POA: Diagnosis not present

## 2020-07-04 DIAGNOSIS — H4052X3 Glaucoma secondary to other eye disorders, left eye, severe stage: Secondary | ICD-10-CM | POA: Diagnosis not present

## 2020-07-04 DIAGNOSIS — H401112 Primary open-angle glaucoma, right eye, moderate stage: Secondary | ICD-10-CM | POA: Diagnosis not present

## 2020-07-18 DIAGNOSIS — H35371 Puckering of macula, right eye: Secondary | ICD-10-CM | POA: Diagnosis not present

## 2020-07-18 DIAGNOSIS — H34813 Central retinal vein occlusion, bilateral, with macular edema: Secondary | ICD-10-CM | POA: Diagnosis not present

## 2020-07-18 DIAGNOSIS — Z961 Presence of intraocular lens: Secondary | ICD-10-CM | POA: Diagnosis not present

## 2020-07-18 DIAGNOSIS — H269 Unspecified cataract: Secondary | ICD-10-CM | POA: Diagnosis not present

## 2020-07-18 DIAGNOSIS — H35372 Puckering of macula, left eye: Secondary | ICD-10-CM | POA: Diagnosis not present

## 2020-07-18 DIAGNOSIS — H40212 Acute angle-closure glaucoma, left eye: Secondary | ICD-10-CM | POA: Diagnosis not present

## 2020-07-18 DIAGNOSIS — H43811 Vitreous degeneration, right eye: Secondary | ICD-10-CM | POA: Diagnosis not present

## 2020-07-18 DIAGNOSIS — H409 Unspecified glaucoma: Secondary | ICD-10-CM | POA: Diagnosis not present

## 2020-08-01 DIAGNOSIS — E039 Hypothyroidism, unspecified: Secondary | ICD-10-CM | POA: Diagnosis not present

## 2020-08-19 DIAGNOSIS — H34811 Central retinal vein occlusion, right eye, with macular edema: Secondary | ICD-10-CM | POA: Diagnosis not present

## 2020-09-02 DIAGNOSIS — H4052X3 Glaucoma secondary to other eye disorders, left eye, severe stage: Secondary | ICD-10-CM | POA: Diagnosis not present

## 2020-09-26 DIAGNOSIS — H34813 Central retinal vein occlusion, bilateral, with macular edema: Secondary | ICD-10-CM | POA: Diagnosis not present

## 2020-10-24 DIAGNOSIS — H34813 Central retinal vein occlusion, bilateral, with macular edema: Secondary | ICD-10-CM | POA: Diagnosis not present

## 2020-11-21 DIAGNOSIS — H34813 Central retinal vein occlusion, bilateral, with macular edema: Secondary | ICD-10-CM | POA: Diagnosis not present

## 2020-12-05 DIAGNOSIS — F419 Anxiety disorder, unspecified: Secondary | ICD-10-CM | POA: Diagnosis not present

## 2020-12-05 DIAGNOSIS — I10 Essential (primary) hypertension: Secondary | ICD-10-CM | POA: Diagnosis not present

## 2020-12-05 DIAGNOSIS — R7303 Prediabetes: Secondary | ICD-10-CM | POA: Diagnosis not present

## 2020-12-05 DIAGNOSIS — Z853 Personal history of malignant neoplasm of breast: Secondary | ICD-10-CM | POA: Diagnosis not present

## 2020-12-05 DIAGNOSIS — E039 Hypothyroidism, unspecified: Secondary | ICD-10-CM | POA: Diagnosis not present

## 2020-12-05 DIAGNOSIS — E7800 Pure hypercholesterolemia, unspecified: Secondary | ICD-10-CM | POA: Diagnosis not present

## 2020-12-06 ENCOUNTER — Telehealth: Payer: Self-pay | Admitting: *Deleted

## 2020-12-06 NOTE — Telephone Encounter (Signed)
Received VM from pt requesting yearly f/u appt with MD.  RN scheduled next available and attempt x1 to contact pt with appt info.  No answer, LVM for pt to return call to the office.

## 2020-12-13 NOTE — Progress Notes (Signed)
Patient Care Team: Rankins, Bill Salinas, MD as PCP - General (Family Medicine)  DIAGNOSIS:    ICD-10-CM   1. Malignant neoplasm of lower-outer quadrant of right breast of female, estrogen receptor positive ([place])  C50.511    Z17.0       SUMMARY OF ONCOLOGIC HISTORY: Oncology History  Breast cancer of lower-outer quadrant of right female breast ([place])  05/23/2015 Initial Diagnosis   Right breast biopsy 8:00 position: Invasive ductal carcinoma with DCIS, grade 1, ER 100%, PR 100%, Ki-67 10%, HER-2 negative ratio 1.36, mammogram revealed a 10 mm mass 8 cm from the nipple, T1b N0 stage IA clinical stage   06/20/2015 Surgery   Right lumpectomy [name]): IDC with DCIS, 1 cm, invasive cancer focally at anterior margin, 0/4 lymph nodes negative, T1 BN 0 stage IA; resection of the anterior margin is negative for cancer, Oncotype DX score 7, 5% ROR   06/20/2015 Oncotype testing   Recurrence score: 7; 5% ROR (low-risk)   06/29/2015 Surgery   Right breast re-excision [name]): No tumor.    07/26/2015 - 08/22/2015 Radiation Therapy   Adjuvant breast radiation [name]). The Right breast was treated to 42.5 Gy in 17 fractions at 2.5 Gy per fraction.  The Right breast was boosted to  7.5 Gy in 3 fractions at 2.5 Gy per fraction.    08/2015 - 08/2020 Anti-estrogen oral therapy   Anastrozole 1 mg daily. Planned duration of therapy: 5 years.      CHIEF COMPLIANT: Follow-up of right breast cancer    INTERVAL HISTORY: Tiffany Thompson is a 70 y.o. with above-mentioned history of  right breast cancer treated with lumpectomy, radiation, and is currently on antiestrogen therapy with anastrozole. Mammogram on 06/27/2020 showed no evidence of malignancy bilaterally, and a benign left breast cyst. She presents to the clinic today for annual follow-up.  She completed 5 years of anastrozole therapy and she discontinued it in July 2022.  She had no problems tolerating it.  ALLERGIES:  is allergic to  brimonidine, dorzolamide, and dorzolamide hcl-timolol mal.  MEDICATIONS:  Current Outpatient Medications  Medication Sig Dispense Refill   acetaminophen (TYLENOL) 500 MG tablet Take 500 mg by mouth as needed for mild pain.     atorvastatin (LIPITOR) 20 MG tablet Take 20 mg by mouth daily.     Calcium Carb-Cholecalciferol (CALCIUM + D3) 600-200 MG-UNIT TABS Take 1 tablet by mouth 2 (two) times daily.     clonazePAM (KLONOPIN) 1 MG tablet Take 0.5-1 mg by mouth 2 (two) times daily as needed for anxiety.      hydrochlorothiazide (HYDRODIURIL) 25 MG tablet Take 25 mg by mouth daily.     ibuprofen (ADVIL,MOTRIN) 200 MG tablet Take 200 mg by mouth every 6 (six) hours as needed.     latanoprost (XALATAN) 0.005 % ophthalmic solution Place 1 drop into the left eye at bedtime.      levothyroxine (SYNTHROID) 137 MCG tablet Take 1 tablet (137 mcg total) by mouth daily before breakfast.     Omega-3 Fatty Acids (FISH OIL) 1000 MG CAPS Take 2,000 mg by mouth 2 (two) times daily.      OVER THE COUNTER MEDICATION Take 2,000 mg by mouth daily. Cinnamon $RemoveBeforeD'1000mg'[id number]$      potassium chloride (K-DUR,KLOR-CON) 10 MEQ tablet Take 20 mEq by mouth daily.   0   telmisartan (MICARDIS) 40 MG tablet Take 40 mg by mouth daily.     timolol (BETIMOL) 0.5 % ophthalmic solution Place 1 drop into both eyes  2 (two) times daily. 10 mL 12   No current facility-administered medications for this visit.    PHYSICAL EXAMINATION: ECOG PERFORMANCE STATUS: 1 - Symptomatic but completely ambulatory  Vitals:   12/14/20 1001  BP: (!) 162/97  Pulse: (!) 112  Resp: 19  Temp: (!) 97.5 F (36.4 C)  SpO2: 97%   Filed Weights   12/14/20 1001  Weight: 182 lb 1.6 oz (82.6 kg)    BREAST: Refused breast exam today because she had breast exams with her gynecologist. LABORATORY DATA:  I have reviewed the data as listed CMP Latest Ref Rng & Units 06/13/2015 06/01/2015 07/17/2010  Glucose 65 - 99 mg/dL 108(H) 114 108(H)  BUN 6 - 20 mg/dL 17  28.2(H) 23  Creatinine 0.44 - 1.00 mg/dL 1.00 1.2(H) 1.50(H)  Sodium 135 - 145 mmol/L 136 139 137  Potassium 3.5 - 5.1 mmol/L 3.8 3.3(L) 3.6  Chloride 101 - 111 mmol/L 96(L) - 103  CO2 22 - 32 mmol/L 28 30(H) 21  Calcium 8.9 - 10.3 mg/dL 10.0 10.1 9.3  Total Protein 6.4 - 8.3 g/dL - 7.4 7.1  Total Bilirubin 0.20 - 1.20 mg/dL - 0.48 0.4  Alkaline Phos 40 - 150 U/L - 141 121(H)  AST 5 - 34 U/L - 15 25  ALT 0 - 55 U/L - 21 23    Lab Results  Component Value Date   WBC 7.6 06/01/2015   HGB 14.1 06/01/2015   HCT 40.7 06/01/2015   MCV 87.7 06/01/2015   PLT 280 06/01/2015   NEUTROABS 5.5 06/01/2015    ASSESSMENT & PLAN:  Breast cancer of lower-outer quadrant of right female breast ([place]) Right breast biopsy 8:00 position 05/23/2015: Invasive ductal carcinoma with DCIS, grade 1, ER 100%, PR 100%, Ki-67 10%, HER-2 negative ratio 1.36, mammogram revealed a 10 mm mass 8 cm from the nipple, T1b N0 stage IA clinical stage Right lumpectomy: IDC with DCIS, 1 cm, invasive cancer focally at anterior margin, 0/4 lymph nodes negative, T1 BN 0 stage IA; 3 resection of the anterior margin is negative for cancer, Oncotype DX score 7, 5% ROR Adj XRT 5/30- 08/26/15   Current Treatment: Anastrozole started Aug 2017 completed July 2022   Survivorship: Patient has been exercising 3-4 times a week with the Silver sneakers program at [place] surveillance:  1. Mammograms 06/23/2019: Benign breast density category B.  Targeted ultrasound showed benign cyst 2 o'clock position measuring 6 mm. Patient informed me that she gets breast exams by her gynecologist.  Return to clinic on an as-needed basis    No orders of the defined types were placed in this encounter.  The patient has a good understanding of the overall plan. she agrees with it. she will call with any problems that may develop before the next visit here.  Total time spent: 20 mins including face to face time and time  spent for planning, charting and coordination of care  Rulon Eisenmenger, MD, MPH 12/14/2020  I, Thana Ates, am acting as scribe for Dr. Nicholas Lose.  I have reviewed the above documentation for accuracy and completeness, and I agree with the above.

## 2020-12-14 ENCOUNTER — Inpatient Hospital Stay: Payer: Medicare PPO | Attending: Hematology and Oncology | Admitting: Hematology and Oncology

## 2020-12-14 ENCOUNTER — Other Ambulatory Visit: Payer: Self-pay

## 2020-12-14 DIAGNOSIS — Z923 Personal history of irradiation: Secondary | ICD-10-CM | POA: Insufficient documentation

## 2020-12-14 DIAGNOSIS — Z853 Personal history of malignant neoplasm of breast: Secondary | ICD-10-CM | POA: Insufficient documentation

## 2020-12-14 DIAGNOSIS — Z170 Estrogen receptor positive status [ER+]: Secondary | ICD-10-CM

## 2020-12-14 DIAGNOSIS — C50511 Malignant neoplasm of lower-outer quadrant of right female breast: Secondary | ICD-10-CM | POA: Diagnosis not present

## 2020-12-14 NOTE — Assessment & Plan Note (Signed)
Right breast biopsy 8:00 position 05/23/2015: Invasive ductal carcinoma with DCIS, grade 1, ER 100%, PR 100%, Ki-67 10%, HER-2 negative ratio 1.36, mammogram revealed a 10 mm mass 8 cm from the nipple, T1b N0 stage IA clinical stage Right lumpectomy: IDC with DCIS, 1 cm, invasive cancer focally at anterior margin, 0/4 lymph nodes negative, T1 BN 0 stage IA; 3 resection of the anterior margin is negative for cancer, Oncotype DX score 7, 5% ROR Adj XRT 5/30- 08/26/15  Current Treatment: Anastrozole started Aug 2017  Anastrozole Toxicities: Denies any hot flashes or myalgias.  Survivorship:Patient has been exercising 3-4 times a weekwith the Silver sneakers program at [employer]  Breast cancer surveillance:  1. Mammograms 06/23/2019: Benign breast density category B.  Targeted ultrasound showed benign cyst 2 o'clock position measuring 6 mm. Patient informed me that she gets breast exams by her gynecologist.  I sent a new prescription for anastrozole to her pharmacy Return to clinic on an as-needed basis. 

## 2020-12-19 DIAGNOSIS — H34811 Central retinal vein occlusion, right eye, with macular edema: Secondary | ICD-10-CM | POA: Diagnosis not present

## 2020-12-19 DIAGNOSIS — H34813 Central retinal vein occlusion, bilateral, with macular edema: Secondary | ICD-10-CM | POA: Diagnosis not present

## 2020-12-26 DIAGNOSIS — H34811 Central retinal vein occlusion, right eye, with macular edema: Secondary | ICD-10-CM | POA: Diagnosis not present

## 2020-12-26 DIAGNOSIS — H401112 Primary open-angle glaucoma, right eye, moderate stage: Secondary | ICD-10-CM | POA: Diagnosis not present

## 2020-12-26 DIAGNOSIS — H4052X3 Glaucoma secondary to other eye disorders, left eye, severe stage: Secondary | ICD-10-CM | POA: Diagnosis not present

## 2021-01-16 DIAGNOSIS — H34811 Central retinal vein occlusion, right eye, with macular edema: Secondary | ICD-10-CM | POA: Diagnosis not present

## 2021-02-13 DIAGNOSIS — H34811 Central retinal vein occlusion, right eye, with macular edema: Secondary | ICD-10-CM | POA: Diagnosis not present

## 2021-02-13 DIAGNOSIS — Z961 Presence of intraocular lens: Secondary | ICD-10-CM | POA: Diagnosis not present

## 2021-02-13 DIAGNOSIS — H43811 Vitreous degeneration, right eye: Secondary | ICD-10-CM | POA: Diagnosis not present

## 2021-02-13 DIAGNOSIS — H409 Unspecified glaucoma: Secondary | ICD-10-CM | POA: Diagnosis not present

## 2021-02-13 DIAGNOSIS — H35371 Puckering of macula, right eye: Secondary | ICD-10-CM | POA: Diagnosis not present

## 2021-03-17 DIAGNOSIS — H34811 Central retinal vein occlusion, right eye, with macular edema: Secondary | ICD-10-CM | POA: Diagnosis not present

## 2021-04-03 DIAGNOSIS — H401112 Primary open-angle glaucoma, right eye, moderate stage: Secondary | ICD-10-CM | POA: Diagnosis not present

## 2021-04-10 DIAGNOSIS — H34811 Central retinal vein occlusion, right eye, with macular edema: Secondary | ICD-10-CM | POA: Diagnosis not present

## 2021-05-08 DIAGNOSIS — H34811 Central retinal vein occlusion, right eye, with macular edema: Secondary | ICD-10-CM | POA: Diagnosis not present

## 2021-05-19 ENCOUNTER — Other Ambulatory Visit: Payer: Self-pay | Admitting: Family Medicine

## 2021-05-19 DIAGNOSIS — Z1231 Encounter for screening mammogram for malignant neoplasm of breast: Secondary | ICD-10-CM

## 2021-05-22 DIAGNOSIS — Z853 Personal history of malignant neoplasm of breast: Secondary | ICD-10-CM | POA: Diagnosis not present

## 2021-05-22 DIAGNOSIS — F419 Anxiety disorder, unspecified: Secondary | ICD-10-CM | POA: Diagnosis not present

## 2021-05-22 DIAGNOSIS — I10 Essential (primary) hypertension: Secondary | ICD-10-CM | POA: Diagnosis not present

## 2021-05-22 DIAGNOSIS — Z0000 Encounter for general adult medical examination without abnormal findings: Secondary | ICD-10-CM | POA: Diagnosis not present

## 2021-05-22 DIAGNOSIS — R7303 Prediabetes: Secondary | ICD-10-CM | POA: Diagnosis not present

## 2021-05-22 DIAGNOSIS — E7800 Pure hypercholesterolemia, unspecified: Secondary | ICD-10-CM | POA: Diagnosis not present

## 2021-05-22 DIAGNOSIS — E039 Hypothyroidism, unspecified: Secondary | ICD-10-CM | POA: Diagnosis not present

## 2021-05-22 DIAGNOSIS — Z23 Encounter for immunization: Secondary | ICD-10-CM | POA: Diagnosis not present

## 2021-06-05 DIAGNOSIS — H34811 Central retinal vein occlusion, right eye, with macular edema: Secondary | ICD-10-CM | POA: Diagnosis not present

## 2021-07-03 DIAGNOSIS — H34811 Central retinal vein occlusion, right eye, with macular edema: Secondary | ICD-10-CM | POA: Diagnosis not present

## 2021-07-10 ENCOUNTER — Ambulatory Visit
Admission: RE | Admit: 2021-07-10 | Discharge: 2021-07-10 | Disposition: A | Discharge status: Home / Self Care | Payer: Medicare PPO | Source: Ambulatory Visit | Attending: Family Medicine | Admitting: Family Medicine

## 2021-07-10 DIAGNOSIS — Z1231 Encounter for screening mammogram for malignant neoplasm of breast: Secondary | ICD-10-CM

## 2021-07-10 HISTORY — DX: Malignant neoplasm of unspecified site of unspecified female breast: C50.919

## 2021-07-31 DIAGNOSIS — H35373 Puckering of macula, bilateral: Secondary | ICD-10-CM | POA: Diagnosis not present

## 2021-07-31 DIAGNOSIS — H34811 Central retinal vein occlusion, right eye, with macular edema: Secondary | ICD-10-CM | POA: Diagnosis not present

## 2021-07-31 DIAGNOSIS — H348122 Central retinal vein occlusion, left eye, stable: Secondary | ICD-10-CM | POA: Diagnosis not present

## 2021-09-03 IMAGING — MG DIGITAL DIAGNOSTIC BILAT W/ TOMO W/ CAD
8 of 13 series · 8 of 37 positions shown · non-contrast
Comparison: Previous exam(s).

CLINICAL DATA: 68-year-old who underwent malignant lumpectomy of
the LOWER OUTER RIGHT breast in May 2015, pathology grade 1
invasive ductal carcinoma and DCIS. Adjuvant radiation therapy.
Annual evaluation.

EXAM:
DIGITAL DIAGNOSTIC BILATERAL MAMMOGRAM WITH CAD AND TOMO
ULTRASOUND LEFT BREAST

[R MLO]
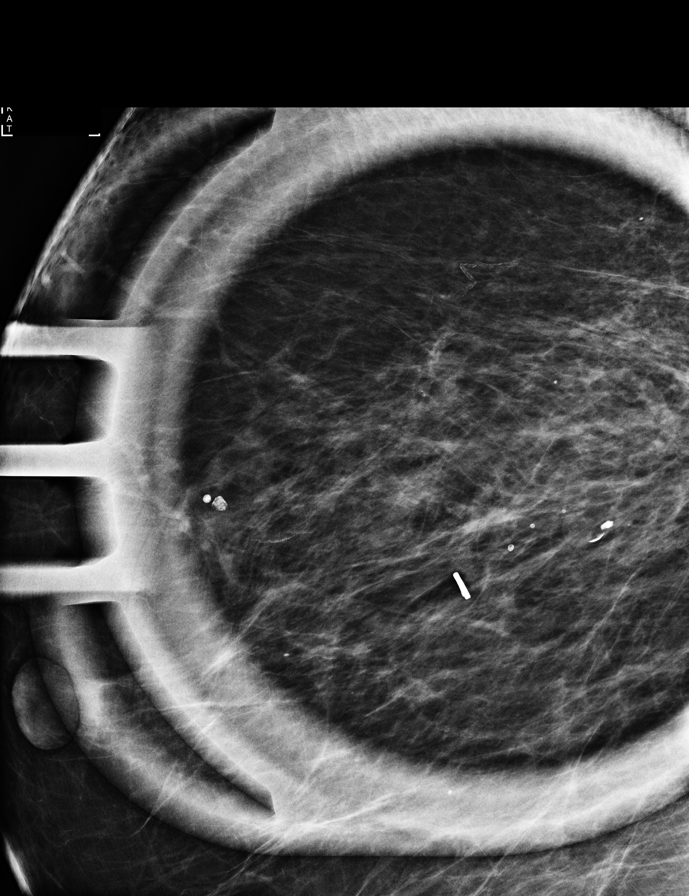

[L MLO synth-2D (1 of 2)]
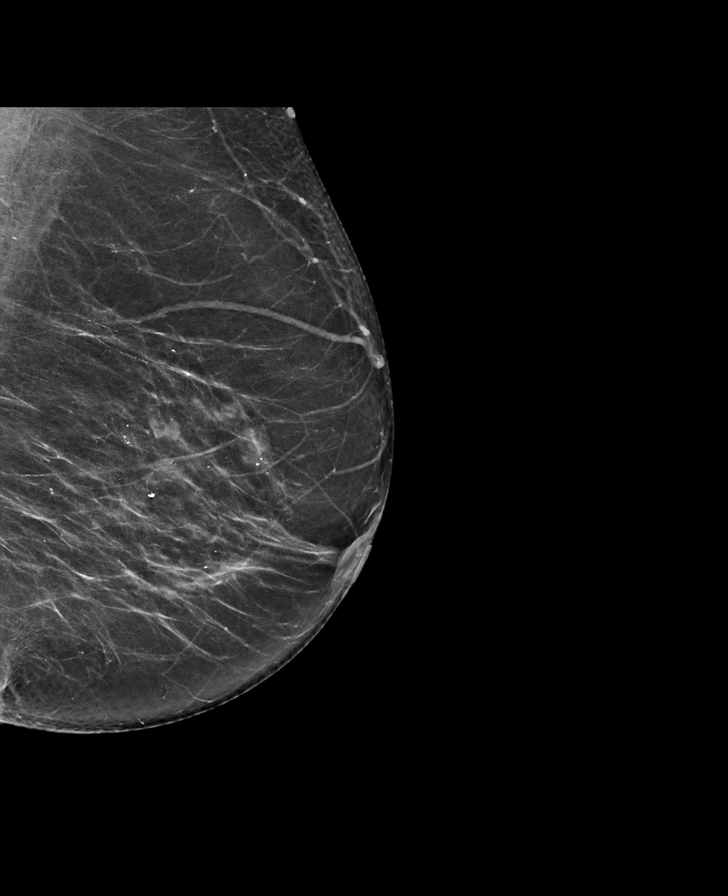

[L MLO synth-2D (2 of 2)]
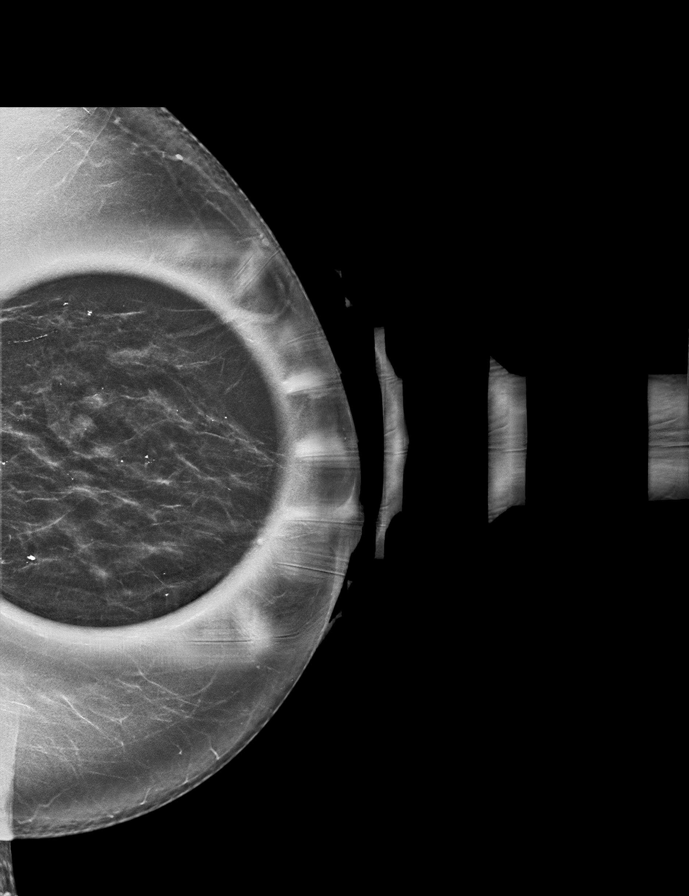

[R MLO synth-2D]
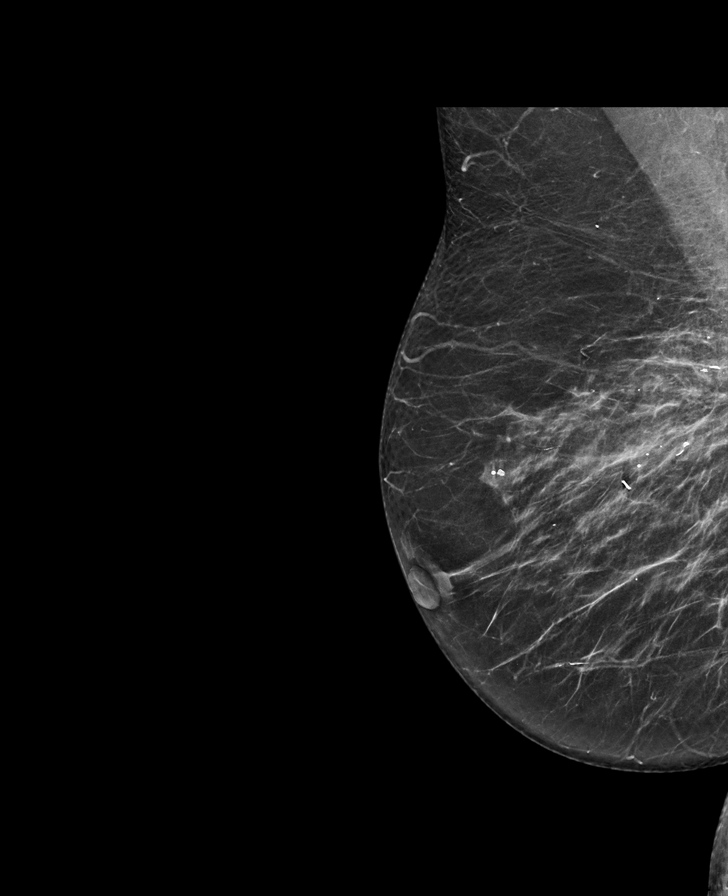

[R CC synth-2D]
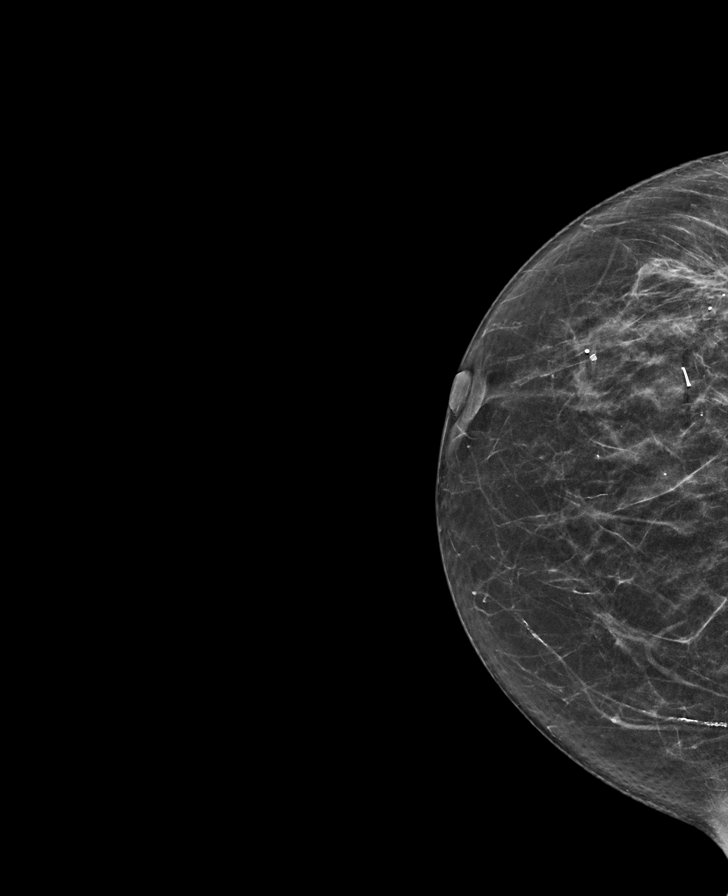

[L CC synth-2D (1 of 2)]
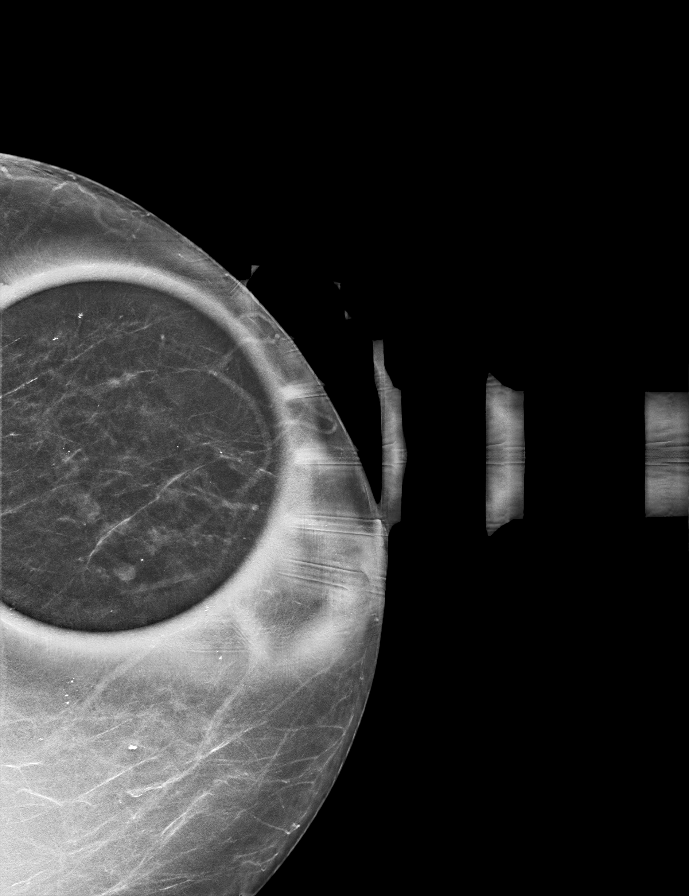

[L CC synth-2D (2 of 2)]
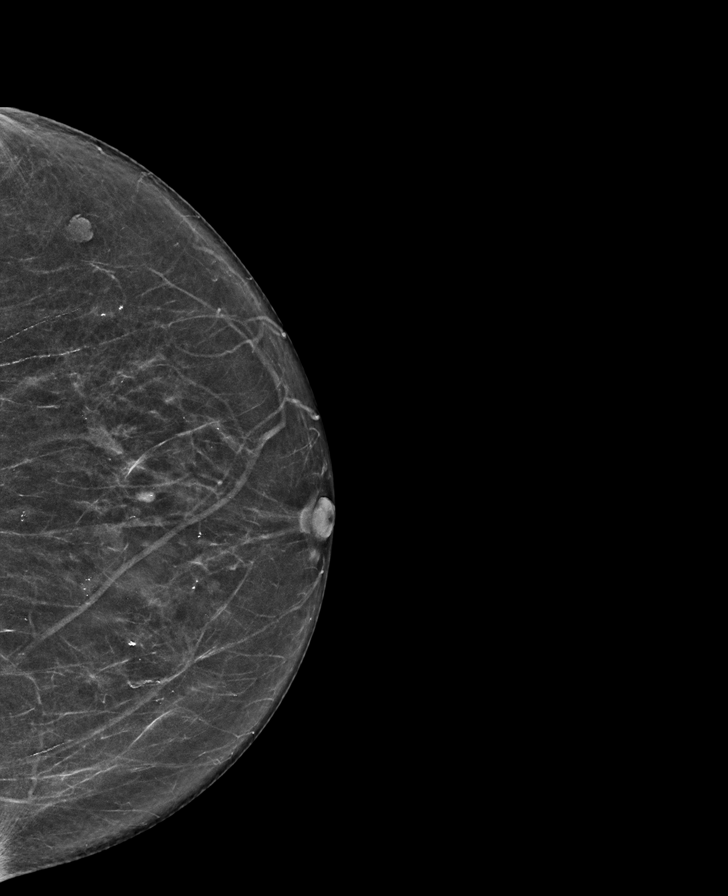

[R CC tomo · tomo slice 33/65.0]
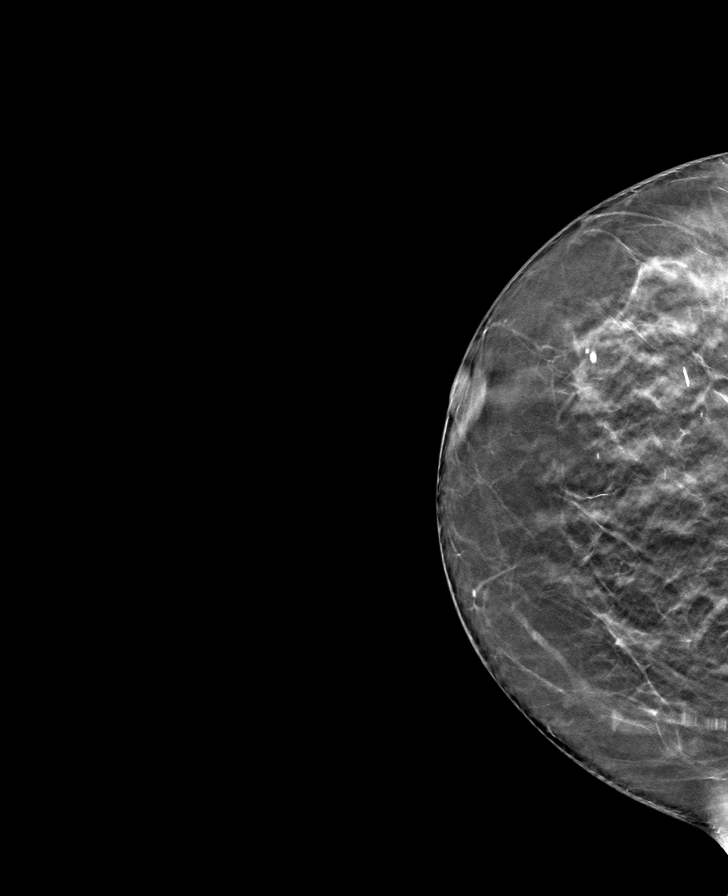

[8 of 37 positions shown; findings below may reference images not displayed]

ACR Breast Density Category b: There are scattered areas of
fibroglandular density.
FINDINGS: Tomosynthesis and synthesized full field CC and MLO views of both
breasts were obtained. Standard spot magnification MLO view of the
lumpectomy site in the RIGHT breast and tomosynthesis and
synthesized spot compression CC and MLO views of a mammographic
finding in the LEFT breast were also obtained.

Post surgical scar/architectural distortion at the lumpectomy site
in the LOWER OUTER RIGHT breast at POSTERIOR depth. Residual post
radiation trabecular thickening involving the RIGHT breast,
minimally improved since the examination 1 year ago. No new or
suspicious findings in the RIGHT breast.

New focal asymmetry/isodense mass in the OUTER LEFT breast at MIDDLE
depth which persists on the spot compression images, though is
compressible. There is no associated architectural distortion or
suspicious calcifications. No new or suspicious findings elsewhere
in the LEFT breast.

Mammographic images were processed with CAD.

Targeted LEFT breast ultrasound is performed, showing a benign cyst
at the 2 o'clock position approximately 3 cm from the nipple at
MIDDLE depth measuring approximately 6 x 4 x 5 mm, demonstrating
posterior acoustic enhancement and no internal power Doppler flow,
corresponding to the mammographic finding. No suspicious solid mass
or abnormal acoustic shadowing is identified.
IMPRESSION: 1. No mammographic or sonographic evidence of malignancy involving
the LEFT breast.
2. No mammographic evidence of malignancy involving the RIGHT
breast.
3. Benign 6 mm cyst in the UPPER OUTER QUADRANT of the LEFT breast
which accounts for a mammographic finding.
4. Expected post lumpectomy changes and improving post radiation
changes involving the RIGHT breast.

RECOMMENDATION:
BILATERAL diagnostic mammography in 1 year.

I have discussed the findings and recommendations with the patient.
If applicable, a reminder letter will be sent to the patient
regarding the next appointment.

BI-RADS CATEGORY  2: Benign.

## 2022-02-23 DIAGNOSIS — H34811 Central retinal vein occlusion, right eye, with macular edema: Secondary | ICD-10-CM | POA: Diagnosis not present

## 2022-03-26 DIAGNOSIS — H34811 Central retinal vein occlusion, right eye, with macular edema: Secondary | ICD-10-CM | POA: Diagnosis not present

## 2022-04-09 DIAGNOSIS — H401112 Primary open-angle glaucoma, right eye, moderate stage: Secondary | ICD-10-CM | POA: Diagnosis not present

## 2022-04-23 DIAGNOSIS — H34811 Central retinal vein occlusion, right eye, with macular edema: Secondary | ICD-10-CM | POA: Diagnosis not present

## 2022-05-21 DIAGNOSIS — H34811 Central retinal vein occlusion, right eye, with macular edema: Secondary | ICD-10-CM | POA: Diagnosis not present

## 2022-05-28 DIAGNOSIS — Z0000 Encounter for general adult medical examination without abnormal findings: Secondary | ICD-10-CM | POA: Diagnosis not present

## 2022-05-28 DIAGNOSIS — I10 Essential (primary) hypertension: Secondary | ICD-10-CM | POA: Diagnosis not present

## 2022-05-28 DIAGNOSIS — F419 Anxiety disorder, unspecified: Secondary | ICD-10-CM | POA: Diagnosis not present

## 2022-05-28 DIAGNOSIS — Z6830 Body mass index (BMI) 30.0-30.9, adult: Secondary | ICD-10-CM | POA: Diagnosis not present

## 2022-05-28 DIAGNOSIS — E039 Hypothyroidism, unspecified: Secondary | ICD-10-CM | POA: Diagnosis not present

## 2022-05-28 DIAGNOSIS — E7800 Pure hypercholesterolemia, unspecified: Secondary | ICD-10-CM | POA: Diagnosis not present

## 2022-05-28 DIAGNOSIS — R7303 Prediabetes: Secondary | ICD-10-CM | POA: Diagnosis not present

## 2022-05-29 ENCOUNTER — Other Ambulatory Visit: Payer: Self-pay | Admitting: Family Medicine

## 2022-05-29 DIAGNOSIS — Z0000 Encounter for general adult medical examination without abnormal findings: Secondary | ICD-10-CM

## 2022-06-18 DIAGNOSIS — H43811 Vitreous degeneration, right eye: Secondary | ICD-10-CM | POA: Diagnosis not present

## 2022-06-18 DIAGNOSIS — H34813 Central retinal vein occlusion, bilateral, with macular edema: Secondary | ICD-10-CM | POA: Diagnosis not present

## 2022-06-18 DIAGNOSIS — H35371 Puckering of macula, right eye: Secondary | ICD-10-CM | POA: Diagnosis not present

## 2022-06-18 DIAGNOSIS — H4089 Other specified glaucoma: Secondary | ICD-10-CM | POA: Diagnosis not present

## 2022-06-18 DIAGNOSIS — H34811 Central retinal vein occlusion, right eye, with macular edema: Secondary | ICD-10-CM | POA: Diagnosis not present

## 2022-06-18 DIAGNOSIS — H269 Unspecified cataract: Secondary | ICD-10-CM | POA: Diagnosis not present

## 2022-06-18 DIAGNOSIS — Z961 Presence of intraocular lens: Secondary | ICD-10-CM | POA: Diagnosis not present

## 2022-07-12 ENCOUNTER — Ambulatory Visit: Payer: Medicare PPO

## 2022-07-12 ENCOUNTER — Ambulatory Visit
Admission: RE | Admit: 2022-07-12 | Discharge: 2022-07-12 | Disposition: A | Discharge status: Home / Self Care | Payer: Medicare PPO | Source: Ambulatory Visit | Attending: Family Medicine | Admitting: Family Medicine

## 2022-07-12 DIAGNOSIS — Z0000 Encounter for general adult medical examination without abnormal findings: Secondary | ICD-10-CM

## 2022-07-12 DIAGNOSIS — Z1231 Encounter for screening mammogram for malignant neoplasm of breast: Secondary | ICD-10-CM | POA: Diagnosis not present

## 2022-07-16 DIAGNOSIS — H34811 Central retinal vein occlusion, right eye, with macular edema: Secondary | ICD-10-CM | POA: Diagnosis not present

## 2022-08-09 DIAGNOSIS — H401112 Primary open-angle glaucoma, right eye, moderate stage: Secondary | ICD-10-CM | POA: Diagnosis not present

## 2022-08-13 DIAGNOSIS — H34811 Central retinal vein occlusion, right eye, with macular edema: Secondary | ICD-10-CM | POA: Diagnosis not present

## 2022-08-13 DIAGNOSIS — H348122 Central retinal vein occlusion, left eye, stable: Secondary | ICD-10-CM | POA: Diagnosis not present

## 2022-08-13 DIAGNOSIS — H35371 Puckering of macula, right eye: Secondary | ICD-10-CM | POA: Diagnosis not present

## 2022-09-10 DIAGNOSIS — H34811 Central retinal vein occlusion, right eye, with macular edema: Secondary | ICD-10-CM | POA: Diagnosis not present

## 2022-10-08 DIAGNOSIS — H34811 Central retinal vein occlusion, right eye, with macular edema: Secondary | ICD-10-CM | POA: Diagnosis not present

## 2022-11-16 DIAGNOSIS — H409 Unspecified glaucoma: Secondary | ICD-10-CM | POA: Diagnosis not present

## 2022-11-16 DIAGNOSIS — H34811 Central retinal vein occlusion, right eye, with macular edema: Secondary | ICD-10-CM | POA: Diagnosis not present

## 2022-11-16 DIAGNOSIS — H35371 Puckering of macula, right eye: Secondary | ICD-10-CM | POA: Diagnosis not present

## 2022-11-16 DIAGNOSIS — H43811 Vitreous degeneration, right eye: Secondary | ICD-10-CM | POA: Diagnosis not present

## 2022-11-16 DIAGNOSIS — Z961 Presence of intraocular lens: Secondary | ICD-10-CM | POA: Diagnosis not present

## 2022-11-16 DIAGNOSIS — H348122 Central retinal vein occlusion, left eye, stable: Secondary | ICD-10-CM | POA: Diagnosis not present

## 2022-11-16 DIAGNOSIS — H259 Unspecified age-related cataract: Secondary | ICD-10-CM | POA: Diagnosis not present

## 2022-11-26 DIAGNOSIS — R7303 Prediabetes: Secondary | ICD-10-CM | POA: Diagnosis not present

## 2022-11-26 DIAGNOSIS — E7800 Pure hypercholesterolemia, unspecified: Secondary | ICD-10-CM | POA: Diagnosis not present

## 2022-11-26 DIAGNOSIS — E039 Hypothyroidism, unspecified: Secondary | ICD-10-CM | POA: Diagnosis not present

## 2022-11-26 DIAGNOSIS — I10 Essential (primary) hypertension: Secondary | ICD-10-CM | POA: Diagnosis not present

## 2022-12-04 DIAGNOSIS — I10 Essential (primary) hypertension: Secondary | ICD-10-CM | POA: Diagnosis not present

## 2022-12-04 DIAGNOSIS — E669 Obesity, unspecified: Secondary | ICD-10-CM | POA: Diagnosis not present

## 2022-12-04 DIAGNOSIS — E7800 Pure hypercholesterolemia, unspecified: Secondary | ICD-10-CM | POA: Diagnosis not present

## 2022-12-04 DIAGNOSIS — N182 Chronic kidney disease, stage 2 (mild): Secondary | ICD-10-CM | POA: Diagnosis not present

## 2022-12-04 DIAGNOSIS — E039 Hypothyroidism, unspecified: Secondary | ICD-10-CM | POA: Diagnosis not present

## 2022-12-04 DIAGNOSIS — R7303 Prediabetes: Secondary | ICD-10-CM | POA: Diagnosis not present

## 2022-12-04 DIAGNOSIS — Z6830 Body mass index (BMI) 30.0-30.9, adult: Secondary | ICD-10-CM | POA: Diagnosis not present

## 2022-12-04 DIAGNOSIS — H409 Unspecified glaucoma: Secondary | ICD-10-CM | POA: Diagnosis not present

## 2022-12-04 DIAGNOSIS — H6123 Impacted cerumen, bilateral: Secondary | ICD-10-CM | POA: Diagnosis not present

## 2022-12-21 DIAGNOSIS — H34811 Central retinal vein occlusion, right eye, with macular edema: Secondary | ICD-10-CM | POA: Diagnosis not present

## 2023-01-09 DIAGNOSIS — H6121 Impacted cerumen, right ear: Secondary | ICD-10-CM | POA: Diagnosis not present

## 2023-01-09 DIAGNOSIS — Z6830 Body mass index (BMI) 30.0-30.9, adult: Secondary | ICD-10-CM | POA: Diagnosis not present

## 2023-01-21 DIAGNOSIS — H34811 Central retinal vein occlusion, right eye, with macular edema: Secondary | ICD-10-CM | POA: Diagnosis not present

## 2023-01-21 DIAGNOSIS — H348122 Central retinal vein occlusion, left eye, stable: Secondary | ICD-10-CM | POA: Diagnosis not present

## 2023-01-21 DIAGNOSIS — H35371 Puckering of macula, right eye: Secondary | ICD-10-CM | POA: Diagnosis not present

## 2023-01-31 DIAGNOSIS — H401112 Primary open-angle glaucoma, right eye, moderate stage: Secondary | ICD-10-CM | POA: Diagnosis not present

## 2023-02-18 DIAGNOSIS — H34811 Central retinal vein occlusion, right eye, with macular edema: Secondary | ICD-10-CM | POA: Diagnosis not present

## 2023-03-25 DIAGNOSIS — H348122 Central retinal vein occlusion, left eye, stable: Secondary | ICD-10-CM | POA: Diagnosis not present

## 2023-03-25 DIAGNOSIS — H409 Unspecified glaucoma: Secondary | ICD-10-CM | POA: Diagnosis not present

## 2023-03-25 DIAGNOSIS — H35371 Puckering of macula, right eye: Secondary | ICD-10-CM | POA: Diagnosis not present

## 2023-03-25 DIAGNOSIS — Z961 Presence of intraocular lens: Secondary | ICD-10-CM | POA: Diagnosis not present

## 2023-03-25 DIAGNOSIS — H43811 Vitreous degeneration, right eye: Secondary | ICD-10-CM | POA: Diagnosis not present

## 2023-03-25 DIAGNOSIS — H34811 Central retinal vein occlusion, right eye, with macular edema: Secondary | ICD-10-CM | POA: Diagnosis not present

## 2023-04-22 DIAGNOSIS — H34811 Central retinal vein occlusion, right eye, with macular edema: Secondary | ICD-10-CM | POA: Diagnosis not present

## 2023-05-20 DIAGNOSIS — H34811 Central retinal vein occlusion, right eye, with macular edema: Secondary | ICD-10-CM | POA: Diagnosis not present

## 2023-05-27 DIAGNOSIS — E039 Hypothyroidism, unspecified: Secondary | ICD-10-CM | POA: Diagnosis not present

## 2023-05-27 DIAGNOSIS — M8580 Other specified disorders of bone density and structure, unspecified site: Secondary | ICD-10-CM | POA: Diagnosis not present

## 2023-05-27 DIAGNOSIS — E7800 Pure hypercholesterolemia, unspecified: Secondary | ICD-10-CM | POA: Diagnosis not present

## 2023-05-27 DIAGNOSIS — M8588 Other specified disorders of bone density and structure, other site: Secondary | ICD-10-CM | POA: Diagnosis not present

## 2023-05-27 DIAGNOSIS — I10 Essential (primary) hypertension: Secondary | ICD-10-CM | POA: Diagnosis not present

## 2023-05-27 DIAGNOSIS — R7303 Prediabetes: Secondary | ICD-10-CM | POA: Diagnosis not present

## 2023-06-03 ENCOUNTER — Other Ambulatory Visit: Payer: Self-pay | Admitting: Family Medicine

## 2023-06-03 DIAGNOSIS — M8580 Other specified disorders of bone density and structure, unspecified site: Secondary | ICD-10-CM

## 2023-06-03 DIAGNOSIS — E039 Hypothyroidism, unspecified: Secondary | ICD-10-CM | POA: Diagnosis not present

## 2023-06-03 DIAGNOSIS — H409 Unspecified glaucoma: Secondary | ICD-10-CM | POA: Diagnosis not present

## 2023-06-03 DIAGNOSIS — Z0000 Encounter for general adult medical examination without abnormal findings: Secondary | ICD-10-CM | POA: Diagnosis not present

## 2023-06-03 DIAGNOSIS — Z853 Personal history of malignant neoplasm of breast: Secondary | ICD-10-CM | POA: Diagnosis not present

## 2023-06-03 DIAGNOSIS — N289 Disorder of kidney and ureter, unspecified: Secondary | ICD-10-CM | POA: Diagnosis not present

## 2023-06-03 DIAGNOSIS — E7800 Pure hypercholesterolemia, unspecified: Secondary | ICD-10-CM | POA: Diagnosis not present

## 2023-06-03 DIAGNOSIS — I10 Essential (primary) hypertension: Secondary | ICD-10-CM | POA: Diagnosis not present

## 2023-06-03 DIAGNOSIS — Z6829 Body mass index (BMI) 29.0-29.9, adult: Secondary | ICD-10-CM | POA: Diagnosis not present

## 2023-06-06 DIAGNOSIS — H34811 Central retinal vein occlusion, right eye, with macular edema: Secondary | ICD-10-CM | POA: Diagnosis not present

## 2023-06-06 DIAGNOSIS — H401112 Primary open-angle glaucoma, right eye, moderate stage: Secondary | ICD-10-CM | POA: Diagnosis not present

## 2023-06-06 DIAGNOSIS — H348122 Central retinal vein occlusion, left eye, stable: Secondary | ICD-10-CM | POA: Diagnosis not present

## 2023-06-10 ENCOUNTER — Other Ambulatory Visit: Payer: Self-pay | Admitting: Family Medicine

## 2023-06-10 DIAGNOSIS — M8580 Other specified disorders of bone density and structure, unspecified site: Secondary | ICD-10-CM

## 2023-06-17 DIAGNOSIS — H34811 Central retinal vein occlusion, right eye, with macular edema: Secondary | ICD-10-CM | POA: Diagnosis not present

## 2023-06-18 DIAGNOSIS — N289 Disorder of kidney and ureter, unspecified: Secondary | ICD-10-CM | POA: Diagnosis not present

## 2023-07-15 DIAGNOSIS — H34811 Central retinal vein occlusion, right eye, with macular edema: Secondary | ICD-10-CM | POA: Diagnosis not present

## 2023-07-29 ENCOUNTER — Ambulatory Visit
Admission: RE | Admit: 2023-07-29 | Discharge: 2023-07-29 | Disposition: A | Discharge status: Home / Self Care | Source: Ambulatory Visit | Attending: Family Medicine | Admitting: Family Medicine

## 2023-07-29 DIAGNOSIS — Z1231 Encounter for screening mammogram for malignant neoplasm of breast: Secondary | ICD-10-CM | POA: Diagnosis not present

## 2023-07-29 DIAGNOSIS — M8580 Other specified disorders of bone density and structure, unspecified site: Secondary | ICD-10-CM

## 2023-08-02 ENCOUNTER — Other Ambulatory Visit: Payer: Self-pay | Admitting: Family Medicine

## 2023-08-02 DIAGNOSIS — R928 Other abnormal and inconclusive findings on diagnostic imaging of breast: Secondary | ICD-10-CM

## 2023-08-12 DIAGNOSIS — H348132 Central retinal vein occlusion, bilateral, stable: Secondary | ICD-10-CM | POA: Diagnosis not present

## 2023-08-12 DIAGNOSIS — H35371 Puckering of macula, right eye: Secondary | ICD-10-CM | POA: Diagnosis not present

## 2023-08-12 DIAGNOSIS — H34811 Central retinal vein occlusion, right eye, with macular edema: Secondary | ICD-10-CM | POA: Diagnosis not present

## 2023-08-12 DIAGNOSIS — H353110 Nonexudative age-related macular degeneration, right eye, stage unspecified: Secondary | ICD-10-CM | POA: Diagnosis not present

## 2023-08-12 DIAGNOSIS — H348122 Central retinal vein occlusion, left eye, stable: Secondary | ICD-10-CM | POA: Diagnosis not present

## 2023-08-13 ENCOUNTER — Other Ambulatory Visit: Payer: Self-pay | Admitting: Family Medicine

## 2023-08-13 DIAGNOSIS — R928 Other abnormal and inconclusive findings on diagnostic imaging of breast: Secondary | ICD-10-CM

## 2023-08-15 ENCOUNTER — Ambulatory Visit
Admission: RE | Admit: 2023-08-15 | Discharge: 2023-08-15 | Disposition: A | Discharge status: Home / Self Care | Source: Ambulatory Visit | Attending: Family Medicine | Admitting: Family Medicine

## 2023-08-15 ENCOUNTER — Other Ambulatory Visit: Payer: Self-pay | Admitting: Family Medicine

## 2023-08-15 DIAGNOSIS — R921 Mammographic calcification found on diagnostic imaging of breast: Secondary | ICD-10-CM

## 2023-08-15 DIAGNOSIS — R928 Other abnormal and inconclusive findings on diagnostic imaging of breast: Secondary | ICD-10-CM

## 2023-08-19 ENCOUNTER — Ambulatory Visit
Admission: RE | Admit: 2023-08-19 | Discharge: 2023-08-19 | Disposition: A | Discharge status: Home / Self Care | Source: Ambulatory Visit | Attending: Family Medicine | Admitting: Family Medicine

## 2023-08-19 ENCOUNTER — Ambulatory Visit
Admission: RE | Admit: 2023-08-19 | Discharge: 2023-08-19 | Discharge status: Home / Self Care | Source: Ambulatory Visit | Attending: Family Medicine | Admitting: Family Medicine

## 2023-08-19 DIAGNOSIS — N6322 Unspecified lump in the left breast, upper inner quadrant: Secondary | ICD-10-CM | POA: Diagnosis not present

## 2023-08-19 DIAGNOSIS — R921 Mammographic calcification found on diagnostic imaging of breast: Secondary | ICD-10-CM

## 2023-08-19 DIAGNOSIS — R920 Mammographic microcalcification found on diagnostic imaging of breast: Secondary | ICD-10-CM | POA: Diagnosis not present

## 2023-08-19 DIAGNOSIS — N6489 Other specified disorders of breast: Secondary | ICD-10-CM | POA: Diagnosis not present

## 2023-08-19 HISTORY — PX: BREAST BIOPSY: SHX20

## 2023-08-20 LAB — SURGICAL PATHOLOGY

## 2023-09-09 DIAGNOSIS — H34811 Central retinal vein occlusion, right eye, with macular edema: Secondary | ICD-10-CM | POA: Diagnosis not present

## 2023-09-22 ENCOUNTER — Other Ambulatory Visit (HOSPITAL_BASED_OUTPATIENT_CLINIC_OR_DEPARTMENT_OTHER): Payer: Self-pay | Admitting: Family Medicine

## 2023-09-22 DIAGNOSIS — M8580 Other specified disorders of bone density and structure, unspecified site: Secondary | ICD-10-CM

## 2023-10-07 DIAGNOSIS — H34811 Central retinal vein occlusion, right eye, with macular edema: Secondary | ICD-10-CM | POA: Diagnosis not present

## 2023-10-07 DIAGNOSIS — H35371 Puckering of macula, right eye: Secondary | ICD-10-CM | POA: Diagnosis not present

## 2023-11-04 DIAGNOSIS — H34811 Central retinal vein occlusion, right eye, with macular edema: Secondary | ICD-10-CM | POA: Diagnosis not present

## 2023-12-02 DIAGNOSIS — H35371 Puckering of macula, right eye: Secondary | ICD-10-CM | POA: Diagnosis not present

## 2023-12-02 DIAGNOSIS — H34811 Central retinal vein occlusion, right eye, with macular edema: Secondary | ICD-10-CM | POA: Diagnosis not present

## 2023-12-27 DIAGNOSIS — Z23 Encounter for immunization: Secondary | ICD-10-CM | POA: Diagnosis not present

## 2023-12-27 DIAGNOSIS — H348121 Central retinal vein occlusion, left eye, with retinal neovascularization: Secondary | ICD-10-CM | POA: Diagnosis not present

## 2023-12-27 DIAGNOSIS — I10 Essential (primary) hypertension: Secondary | ICD-10-CM | POA: Diagnosis not present

## 2023-12-27 DIAGNOSIS — Z0000 Encounter for general adult medical examination without abnormal findings: Secondary | ICD-10-CM | POA: Diagnosis not present

## 2023-12-27 DIAGNOSIS — E7800 Pure hypercholesterolemia, unspecified: Secondary | ICD-10-CM | POA: Diagnosis not present

## 2023-12-27 DIAGNOSIS — E039 Hypothyroidism, unspecified: Secondary | ICD-10-CM | POA: Diagnosis not present

## 2023-12-27 DIAGNOSIS — R7303 Prediabetes: Secondary | ICD-10-CM | POA: Diagnosis not present

## 2023-12-30 DIAGNOSIS — H35371 Puckering of macula, right eye: Secondary | ICD-10-CM | POA: Diagnosis not present

## 2023-12-30 DIAGNOSIS — H348112 Central retinal vein occlusion, right eye, stable: Secondary | ICD-10-CM | POA: Diagnosis not present

## 2023-12-30 DIAGNOSIS — H34811 Central retinal vein occlusion, right eye, with macular edema: Secondary | ICD-10-CM | POA: Diagnosis not present

## 2024-01-22 ENCOUNTER — Emergency Department (HOSPITAL_BASED_OUTPATIENT_CLINIC_OR_DEPARTMENT_OTHER)
Admission: EM | Admit: 2024-01-22 | Discharge: 2024-01-22 | Disposition: A | Discharge status: Home / Self Care | Source: Ambulatory Visit | Attending: Emergency Medicine | Admitting: Emergency Medicine

## 2024-01-22 ENCOUNTER — Emergency Department (HOSPITAL_BASED_OUTPATIENT_CLINIC_OR_DEPARTMENT_OTHER)

## 2024-01-22 ENCOUNTER — Other Ambulatory Visit: Payer: Self-pay

## 2024-01-22 ENCOUNTER — Encounter (HOSPITAL_BASED_OUTPATIENT_CLINIC_OR_DEPARTMENT_OTHER): Payer: Self-pay | Admitting: Emergency Medicine

## 2024-01-22 DIAGNOSIS — T07XXXA Unspecified multiple injuries, initial encounter: Secondary | ICD-10-CM

## 2024-01-22 DIAGNOSIS — I6200 Nontraumatic subdural hemorrhage, unspecified: Secondary | ICD-10-CM | POA: Diagnosis not present

## 2024-01-22 DIAGNOSIS — W19XXXA Unspecified fall, initial encounter: Secondary | ICD-10-CM

## 2024-01-22 DIAGNOSIS — W0110XA Fall on same level from slipping, tripping and stumbling with subsequent striking against unspecified object, initial encounter: Secondary | ICD-10-CM | POA: Insufficient documentation

## 2024-01-22 DIAGNOSIS — S0181XA Laceration without foreign body of other part of head, initial encounter: Secondary | ICD-10-CM | POA: Diagnosis not present

## 2024-01-22 DIAGNOSIS — S065X0A Traumatic subdural hemorrhage without loss of consciousness, initial encounter: Secondary | ICD-10-CM | POA: Insufficient documentation

## 2024-01-22 DIAGNOSIS — I10 Essential (primary) hypertension: Secondary | ICD-10-CM | POA: Insufficient documentation

## 2024-01-22 DIAGNOSIS — Z79899 Other long term (current) drug therapy: Secondary | ICD-10-CM | POA: Insufficient documentation

## 2024-01-22 DIAGNOSIS — S065XAA Traumatic subdural hemorrhage with loss of consciousness status unknown, initial encounter: Secondary | ICD-10-CM

## 2024-01-22 DIAGNOSIS — S0083XA Contusion of other part of head, initial encounter: Secondary | ICD-10-CM | POA: Diagnosis not present

## 2024-01-22 DIAGNOSIS — R030 Elevated blood-pressure reading, without diagnosis of hypertension: Secondary | ICD-10-CM

## 2024-01-22 DIAGNOSIS — S0990XA Unspecified injury of head, initial encounter: Secondary | ICD-10-CM | POA: Diagnosis not present

## 2024-01-22 MED ORDER — ACETAMINOPHEN 500 MG PO TABS
1000.0000 mg | ORAL_TABLET | Freq: Once | ORAL | Status: AC
  Administered 2024-01-22: 1000 mg via ORAL
  Filled 2024-01-22: qty 2

## 2024-01-22 MED ORDER — LIDOCAINE-EPINEPHRINE (PF) 2 %-1:200000 IJ SOLN
20.0000 mL | Freq: Once | INTRAMUSCULAR | Status: DC
Start: 2024-01-22 — End: 2024-01-23
  Filled 2024-01-22: qty 20

## 2024-01-22 NOTE — ED Notes (Signed)
 Patient transported to CT

## 2024-01-22 NOTE — ED Triage Notes (Signed)
 Mechanical fall this morning. Hit face over ground. Denies thinners. Denies LOC.  Lac to forehead.

## 2024-01-22 NOTE — ED Notes (Signed)
 Reviewed AVS/discharge instruction with patient. Time allotted for and all questions answered. Patient is agreeable for d/c and escorted to ed exit by staff.

## 2024-01-22 NOTE — ED Provider Notes (Signed)
  Physical Exam  BP 114/81   Pulse (!) 102   Temp 97.6 F (36.4 C) (Oral)   Resp 16   SpO2 96%   Physical Exam Vitals and nursing note reviewed.  HENT:     Head:     Comments: Sutured laceration over forehead Eyes:     Pupils: Pupils are equal, round, and reactive to light.  Cardiovascular:     Rate and Rhythm: Normal rate and regular rhythm.  Pulmonary:     Effort: Pulmonary effort is normal.     Breath sounds: Normal breath sounds.  Abdominal:     Palpations: Abdomen is soft.     Tenderness: There is no abdominal tenderness.  Skin:    General: Skin is warm and dry.  Neurological:     Mental Status: She is alert.  Psychiatric:        Mood and Affect: Mood normal.     Procedures  Procedures  ED Course / MDM   Clinical Course as of 01/22/24 2029  Wed Jan 22, 2024  1725 Reevaluated patient.  She is resting comfortably watching a movie.  Neurologically intact.  Awaiting repeat CT head at 1920 [MP]  2028 Stable subdural.  No midline shift.  Patient has remained stable feeling well ambulating several times to the bathroom.  Appropriate for discharge.  Counseled patient and her daughter at bedside on signs and symptoms to return for. [MP]    Clinical Course User Index [MP] Pamella Ozell LABOR, DO   Medical Decision Making I, Ozell Pamella DO, have assumed care of this patient from the previous provider pending repeat CT head at 6-hour mark from original to revisualize extent and progression of small bleed and disposition  Amount and/or Complexity of Data Reviewed Radiology: ordered.  Risk OTC drugs. Prescription drug management.          Pamella Ozell LABOR, DO 01/22/24 2029

## 2024-01-22 NOTE — ED Provider Notes (Signed)
 [facility] AT [facility] Provider Note   CSN: [id number] Arrival date & time: 01/22/24  1145     Patient presents with: Tiffany Thompson is a 73 y.o. female.   Patient s/p fall this AM. Indicates was waiting for friend to go to gym, saw that they were waiting, and so turned in rush to go outside, and tripped over feet, causing head to hit door frame, and then fell. No loc. Large laceration to forehead. No anticoagulant use. Dull pain to area/head. No neck or back pain. No chest pain or sob. No abd pain or nv. No extremity pain or injury. Felt fine, asymptomatic earlier today including just prior to her mechanical fall. Tetanus up to date.   The history is provided by the patient, medical records and a relative.  Fall Pertinent negatives include no chest pain, no abdominal pain and no shortness of breath.       Prior to Admission medications   Medication Sig Start Date End Date Taking? Authorizing Provider  acetaminophen  (TYLENOL ) 500 MG tablet Take 500 mg by mouth as needed for mild pain.    [provider]  atorvastatin (LIPITOR) 20 MG tablet Take 20 mg by mouth daily.    [provider]  Calcium Carb-Cholecalciferol (CALCIUM + D3) 600-200 MG-UNIT TABS Take 1 tablet by mouth 2 (two) times daily.    [provider]  clonazePAM (KLONOPIN) 1 MG tablet Take 0.5-1 mg by mouth 2 (two) times daily as needed for anxiety.     [provider]  hydrochlorothiazide (HYDRODIURIL) 25 MG tablet Take 25 mg by mouth daily.    [provider]  ibuprofen (ADVIL,MOTRIN) 200 MG tablet Take 200 mg by mouth every 6 (six) hours as needed.    [provider]  latanoprost (XALATAN) 0.005 % ophthalmic solution Place 1 drop into the left eye at bedtime.     [provider]  levothyroxine  (SYNTHROID ) 137 MCG tablet Take 1 tablet (137 mcg total) by mouth daily before breakfast. 07/14/18   Odean Potts, MD   Omega-3 Fatty Acids (FISH OIL) 1000 MG CAPS Take 2,000 mg by mouth 2 (two) times daily.     [provider]  OVER THE COUNTER MEDICATION Take 2,000 mg by mouth daily. Cinnamon 1000mg     [provider]  potassium chloride (K-DUR,KLOR-CON) 10 MEQ tablet Take 20 mEq by mouth daily.  05/25/15   [provider]  telmisartan (MICARDIS) 40 MG tablet Take 40 mg by mouth daily.    [provider]  timolol  (BETIMOL ) 0.5 % ophthalmic solution Place 1 drop into both eyes 2 (two) times daily. 07/16/16   Gudena, Vinay, MD  timolol  (TIMOPTIC ) 0.5 % ophthalmic solution Place 1 drop into both eyes 2 (two) times daily.    [provider]    Allergies: Brimonidine, Dorzolamide, and Dorzolamide hcl-timolol  mal    Review of Systems  Constitutional:  Negative for fever.  Eyes:  Negative for visual disturbance.  Respiratory:  Negative for shortness of breath.   Cardiovascular:  Negative for chest pain.  Gastrointestinal:  Negative for abdominal pain, nausea and vomiting.  Genitourinary:  Negative for flank pain.  Musculoskeletal:  Negative for back pain and neck pain.  Skin:  Positive for wound.  Neurological:  Negative for weakness and numbness.    Updated Vital Signs BP 126/76   Pulse 92   Temp 97.8 F (36.6 C) (Oral)   Resp 16   SpO2 95%  Physical Exam Vitals and nursing note reviewed.  Constitutional:      Appearance: Normal appearance. She is well-developed.  HENT:     Head:     Comments: Lacerations to forehead - one in midline, ~ 4 cm, one left lateral forehead, ~ 1 cm     Nose: Nose normal.     Mouth/Throat:     Mouth: Mucous membranes are moist.  Eyes:     General: No scleral icterus.    Conjunctiva/sclera: Conjunctivae normal.     Pupils: Pupils are equal, round, and reactive to light.  Neck:     Trachea: No tracheal deviation.  Cardiovascular:     Rate and Rhythm: Normal rate and regular rhythm.     Pulses: Normal pulses.     Heart  sounds: Normal heart sounds. No murmur heard.    No friction rub. No gallop.  Pulmonary:     Effort: Pulmonary effort is normal. No respiratory distress.     Breath sounds: Normal breath sounds.  Chest:     Chest wall: No tenderness.  Abdominal:     General: There is no distension.     Palpations: Abdomen is soft.     Tenderness: There is no abdominal tenderness.  Musculoskeletal:        General: No swelling.     Cervical back: Normal range of motion and neck supple. No rigidity or tenderness. No muscular tenderness.     Comments: CTLS spine, non tender, aligned, no step off. No focal extremity pain or tenderness.   Skin:    General: Skin is warm and dry.     Findings: No rash.  Neurological:     Mental Status: She is alert.     Comments: Alert, speech normal. GCS 15. Motor/sens grossly intact bil.   Psychiatric:        Mood and Affect: Mood normal.     (all labs ordered are listed, but only abnormal results are displayed) Labs Reviewed - No data to display  EKG: None  Radiology: CT Head Wo Contrast Result Date: 01/22/2024 EXAM: CT HEAD WITHOUT CONTRAST 01/22/2024 01:21:00 PM TECHNIQUE: CT of the head was performed without the administration of intravenous contrast. Automated exposure control, iterative reconstruction, and/or weight based adjustment of the mA/kV was utilized to reduce the radiation dose to as low as reasonably achievable. COMPARISON: None available. CLINICAL HISTORY: Head trauma, minor (Age >= 65y). Mechanical fall this morning. Forehead laceration. FINDINGS: BRAIN AND VENTRICLES: A small hyperdense subdural hematoma along the falx measures up to 5 mm in thickness. Minimal subdural hemorrhage is also noted along the right tentorium. There is no associated mass effect. No acute large territory infarct, parenchymal hemorrhage, hydrocephalus or midline shift is identified. Cerebral volume is within normal limits for age. A small infarct in the right cerebellar  hemisphere is likely chronic. Calcified atherosclerosis at the skull base. ORBITS: Right cataract extraction. Left-sided glaucoma drainage device. SINUSES: Minimal bilateral mastoid fluid. SOFT TISSUES AND SKULL: Left-sided forehead hematoma. No skull fracture. Results were communicated by telephone to Dr. Franky Gaul on 01/22/2024 at 1:46 pm. IMPRESSION: 1. Small acute subdural hematoma along the falx and right tentorium without mass effect. 2. Forehead hematoma. 3. Small chronic right cerebellar infarct. Electronically signed by: Dasie Hamburg MD 01/22/2024 01:48 PM EST RP Workstation: [id number]     .Laceration Repair  Date/Time: 01/22/2024 12:58 PM  Performed by: Gaul Franky, MD Authorized by: Gaul Franky, MD   Consent:    Consent given by:  Patient Anesthesia:    Anesthesia method:  Local infiltration   Local anesthetic:  Lidocaine  2% WITH epi Laceration details:    Location:  Face   Face location:  Forehead   Length (cm):  4 Pre-procedure details:    Preparation:  Imaging obtained to evaluate for foreign bodies and patient was prepped and draped in usual sterile fashion Exploration:    Imaging outcome: foreign body not noted     Contaminated: no   Treatment:    Area cleansed with:  Saline   Irrigation solution:  Sterile saline   Layers/structures repaired:  Deep subcutaneous Deep subcutaneous:    Suture size:  5-0   Suture material:  Vicryl   Suture technique:  Simple interrupted   Number of sutures:  2 Skin repair:    Repair method:  Sutures   Suture size:  5-0   Suture material:  Prolene   Suture technique:  Simple interrupted   Number of sutures:  9 Repair type:    Repair type:  Intermediate Post-procedure details:    Dressing:  Antibiotic ointment and sterile dressing   Procedure completion:  Tolerated well, no immediate complications .Laceration Repair  Date/Time: 01/22/2024 12:59 PM  Performed by: Bernard Drivers, MD Authorized by: Bernard Drivers, MD    Consent:    Consent given by:  Patient Anesthesia:    Anesthesia method:  Local infiltration   Local anesthetic:  Lidocaine  2% WITH epi Laceration details:    Location:  Face   Face location:  Forehead (left forehead)   Length (cm):  1 Pre-procedure details:    Preparation:  Imaging obtained to evaluate for foreign bodies Exploration:    Imaging outcome: foreign body not noted     Wound exploration: wound explored through full range of motion and entire depth of wound visualized     Contaminated: no   Treatment:    Area cleansed with:  Saline   Irrigation solution:  Sterile saline Skin repair:    Repair method:  Sutures   Suture size:  5-0   Suture material:  Prolene   Suture technique:  Simple interrupted   Number of sutures:  2 Repair type:    Repair type:  Simple Post-procedure details:    Dressing:  Sterile dressing and antibiotic ointment    Medications Ordered in the ED  lidocaine -EPINEPHrine  (XYLOCAINE  W/EPI) 2 %-1:200000 (PF) injection 20 mL (has no administration in time range)  acetaminophen  (TYLENOL ) tablet 1,000 mg (1,000 mg Oral Given 01/22/24 1326)                                    Medical Decision Making Problems Addressed: Accidental fall, initial encounter: acute illness or injury with systemic symptoms that poses a threat to life or bodily functions Contusion of forehead, initial encounter: acute illness or injury with systemic symptoms that poses a threat to life or bodily functions Elevated blood pressure reading: acute illness or injury Essential hypertension: chronic illness or injury Multiple lacerations: acute illness or injury Subdural hematoma (HCC): acute illness or injury with systemic symptoms that poses a threat to life or bodily functions  Amount and/or Complexity of Data Reviewed Independent Historian:     Details: Family, hx External Data Reviewed: notes. Radiology: ordered and independent interpretation performed. Decision-making  details documented in ED Course. Discussion of management or test interpretation with external provider(s): Neurosurgery.   Risk OTC drugs. Prescription drug management. Decision  regarding hospitalization.   Imaging ordered.   Differential diagnosis includes sdh, contusion, laceration, etc. Dispo decision including potential need for admission considered - will get imaging and reassess.   Reviewed nursing notes and prior charts for additional history. External reports reviewed. Additional history from: family.   Wound repaired. Sterile dressing.   CT reviewed/interpreted by me - small SDH - pt/fam deny any anticoag use.  neurosurgery consulted.  Dr Louis reviewed CT - he indicates repeat CT in ~ 6 hours, if not significantly changed/larger, can d/c to home without requiring additional follow up.   Acetaminophen  po.   Recheck pt comfortable, no distress. No headache, no new neuro complaints.   CRITICAL CARE RE: fall with head contusion, subdural hematoma Performed by: Mckenley Birenbaum E Vieno Tarrant Total critical care time: 40 minutes Critical care time was exclusive of separately billable procedures and treating other patients. Critical care was necessary to treat or prevent imminent or life-threatening deterioration. Critical care was time spent personally by me on the following activities: development of treatment plan with patient and/or surrogate as well as nursing, discussions with consultants, evaluation of patient's response to treatment, examination of patient, obtaining history from patient or surrogate, ordering and performing treatments and interventions, ordering and review of laboratory studies, ordering and review of radiographic studies, pulse oximetry and re-evaluation of patient's condition.  1510, signed out to oncoming EDP, Dr Pamella, to f/u on repeat CT, and if ok, not worse, and pt continues to feel ok, potential d/c to home then.      Final diagnoses:  Accidental fall, initial  encounter  Contusion of forehead, initial encounter  Multiple lacerations  Elevated blood pressure reading  Essential hypertension  Subdural hematoma [facility])    ED Discharge Orders     None          Bernard Drivers, MD 01/22/24 1511

## 2024-01-22 NOTE — Discharge Instructions (Addendum)
 It was our pleasure to provide your ER care today - we hope that you feel better.  Keep sutures lacerations very clean and dry - may shower, pad area gently dry.  Have sutures removed, your doctor or urgent care, in 7-10 days.   Your blood pressure is high today - continue meds and follow up with your doctor in the next couple weeks.   Return to ER if worse, new symptoms, new/severe pain, infection of wound, or other concern.

## 2024-01-27 DIAGNOSIS — H348132 Central retinal vein occlusion, bilateral, stable: Secondary | ICD-10-CM | POA: Diagnosis not present

## 2024-01-27 DIAGNOSIS — H43811 Vitreous degeneration, right eye: Secondary | ICD-10-CM | POA: Diagnosis not present

## 2024-01-27 DIAGNOSIS — Z961 Presence of intraocular lens: Secondary | ICD-10-CM | POA: Diagnosis not present

## 2024-01-27 DIAGNOSIS — H34811 Central retinal vein occlusion, right eye, with macular edema: Secondary | ICD-10-CM | POA: Diagnosis not present

## 2024-01-27 DIAGNOSIS — H348122 Central retinal vein occlusion, left eye, stable: Secondary | ICD-10-CM | POA: Diagnosis not present

## 2024-01-27 DIAGNOSIS — H35371 Puckering of macula, right eye: Secondary | ICD-10-CM | POA: Diagnosis not present

## 2024-01-27 DIAGNOSIS — H26499 Other secondary cataract, unspecified eye: Secondary | ICD-10-CM | POA: Diagnosis not present

## 2024-01-31 DIAGNOSIS — S0181XD Laceration without foreign body of other part of head, subsequent encounter: Secondary | ICD-10-CM | POA: Diagnosis not present

## 2024-01-31 DIAGNOSIS — Z4802 Encounter for removal of sutures: Secondary | ICD-10-CM | POA: Diagnosis not present

## 2024-02-06 DIAGNOSIS — H401112 Primary open-angle glaucoma, right eye, moderate stage: Secondary | ICD-10-CM | POA: Diagnosis not present

## 2024-02-24 ENCOUNTER — Other Ambulatory Visit

## 2024-05-11 ENCOUNTER — Other Ambulatory Visit (HOSPITAL_BASED_OUTPATIENT_CLINIC_OR_DEPARTMENT_OTHER)

## 2024-05-26 ENCOUNTER — Other Ambulatory Visit (HOSPITAL_BASED_OUTPATIENT_CLINIC_OR_DEPARTMENT_OTHER)
